# Patient Record
Sex: Female | Born: 1955
Health system: Southern US, Community
[De-identification: ages and names within clinical notes are randomized; demographics above are authoritative.]

## PROBLEM LIST (undated history)

## (undated) DIAGNOSIS — M489 Spondylopathy, unspecified: Secondary | ICD-10-CM

## (undated) DIAGNOSIS — I10 Essential (primary) hypertension: Secondary | ICD-10-CM

## (undated) DIAGNOSIS — R9439 Abnormal result of other cardiovascular function study: Secondary | ICD-10-CM

## (undated) DIAGNOSIS — E7800 Pure hypercholesterolemia, unspecified: Secondary | ICD-10-CM

## (undated) DIAGNOSIS — M25569 Pain in unspecified knee: Secondary | ICD-10-CM

## (undated) DIAGNOSIS — S42009A Fracture of unspecified part of unspecified clavicle, initial encounter for closed fracture: Secondary | ICD-10-CM

## (undated) DIAGNOSIS — M549 Dorsalgia, unspecified: Secondary | ICD-10-CM

## (undated) DIAGNOSIS — D649 Anemia, unspecified: Secondary | ICD-10-CM

## (undated) DIAGNOSIS — D259 Leiomyoma of uterus, unspecified: Secondary | ICD-10-CM

## (undated) DIAGNOSIS — E785 Hyperlipidemia, unspecified: Secondary | ICD-10-CM

## (undated) DIAGNOSIS — Z01810 Encounter for preprocedural cardiovascular examination: Secondary | ICD-10-CM

## (undated) DIAGNOSIS — S2239XA Fracture of one rib, unspecified side, initial encounter for closed fracture: Secondary | ICD-10-CM

## (undated) DIAGNOSIS — R079 Chest pain, unspecified: Secondary | ICD-10-CM

## (undated) DIAGNOSIS — M5030 Other cervical disc degeneration, unspecified cervical region: Secondary | ICD-10-CM

## (undated) DIAGNOSIS — K219 Gastro-esophageal reflux disease without esophagitis: Secondary | ICD-10-CM

## (undated) DIAGNOSIS — L259 Unspecified contact dermatitis, unspecified cause: Secondary | ICD-10-CM

## (undated) HISTORY — DX: Other cervical disc degeneration, unspecified cervical region: M50.30

## (undated) HISTORY — DX: Gastro-esophageal reflux disease without esophagitis: K21.9

## (undated) HISTORY — DX: Anemia, unspecified: D64.9

## (undated) HISTORY — DX: Fracture of unspecified part of unspecified clavicle, initial encounter for closed fracture: S42.009A

## (undated) HISTORY — DX: Spondylopathy, unspecified: M48.9

## (undated) HISTORY — PX: BILATERAL SALPINGOOPHORECTOMY: SHX1223

## (undated) HISTORY — PX: TOTAL ABDOMINAL HYSTERECTOMY: SHX209

## (undated) HISTORY — DX: Hyperlipidemia, unspecified: E78.5

## (undated) HISTORY — DX: Essential (primary) hypertension: I10

## (undated) HISTORY — DX: Pure hypercholesterolemia, unspecified: E78.00

## (undated) HISTORY — DX: Encounter for preprocedural cardiovascular examination: Z01.810

## (undated) HISTORY — DX: Leiomyoma of uterus, unspecified: D25.9

## (undated) HISTORY — DX: Dorsalgia, unspecified: M54.9

## (undated) HISTORY — DX: Chest pain, unspecified: R07.9

## (undated) HISTORY — DX: Abnormal result of other cardiovascular function study: R94.39

## (undated) HISTORY — DX: Fracture of one rib, unspecified side, initial encounter for closed fracture: S22.39XA

## (undated) HISTORY — DX: Pain in unspecified knee: M25.569

## (undated) HISTORY — DX: Unspecified contact dermatitis, unspecified cause: L25.9

---

## 2002-02-21 HISTORY — PX: CARPAL TUNNEL RELEASE: SHX101

## 2002-12-24 DIAGNOSIS — S2239XA Fracture of one rib, unspecified side, initial encounter for closed fracture: Secondary | ICD-10-CM | POA: Insufficient documentation

## 2002-12-24 DIAGNOSIS — M1732 Unilateral post-traumatic osteoarthritis, left knee: Secondary | ICD-10-CM | POA: Insufficient documentation

## 2002-12-24 DIAGNOSIS — S42009A Fracture of unspecified part of unspecified clavicle, initial encounter for closed fracture: Secondary | ICD-10-CM | POA: Insufficient documentation

## 2009-02-28 ENCOUNTER — Inpatient Hospital Stay (HOSPITAL_COMMUNITY): Admission: AD | Admit: 2009-02-28 | Discharge: 2009-02-28 | Payer: Self-pay | Admitting: Obstetrics & Gynecology

## 2009-03-09 ENCOUNTER — Ambulatory Visit: Payer: Self-pay | Admitting: Obstetrics & Gynecology

## 2009-03-09 ENCOUNTER — Other Ambulatory Visit: Admission: RE | Admit: 2009-03-09 | Discharge: 2009-03-09 | Payer: Self-pay | Admitting: Obstetrics & Gynecology

## 2009-03-09 LAB — CONVERTED CEMR LAB
Calcium: 9.5 mg/dL (ref 8.4–10.5)
Chloride: 104 meq/L (ref 96–112)
Glucose, Bld: 89 mg/dL (ref 70–99)
HCT: 41.4 % (ref 36.0–46.0)
Hemoglobin: 13.2 g/dL (ref 12.0–15.0)
RBC: 5.16 M/uL — ABNORMAL HIGH (ref 3.87–5.11)
RDW: 13.9 % (ref 11.5–15.5)
Sodium: 140 meq/L (ref 135–145)
WBC: 9.8 10*3/uL (ref 4.0–10.5)

## 2009-03-15 ENCOUNTER — Ambulatory Visit (HOSPITAL_COMMUNITY): Admission: RE | Admit: 2009-03-15 | Discharge: 2009-03-15 | Payer: Self-pay | Admitting: Family Medicine

## 2009-03-24 ENCOUNTER — Encounter: Payer: Self-pay | Admitting: Obstetrics & Gynecology

## 2009-03-24 ENCOUNTER — Ambulatory Visit: Payer: Self-pay | Admitting: Obstetrics & Gynecology

## 2009-04-01 ENCOUNTER — Ambulatory Visit: Payer: Self-pay | Admitting: Family Medicine

## 2009-04-01 ENCOUNTER — Encounter: Payer: Self-pay | Admitting: Obstetrics and Gynecology

## 2009-04-01 LAB — CONVERTED CEMR LAB
Hemoglobin: 12.8 g/dL (ref 12.0–15.0)
MCHC: 32.9 g/dL (ref 30.0–36.0)

## 2009-05-25 ENCOUNTER — Ambulatory Visit: Payer: Self-pay | Admitting: Nurse Practitioner

## 2009-05-25 DIAGNOSIS — E7800 Pure hypercholesterolemia, unspecified: Secondary | ICD-10-CM | POA: Insufficient documentation

## 2009-05-25 DIAGNOSIS — I10 Essential (primary) hypertension: Secondary | ICD-10-CM | POA: Insufficient documentation

## 2009-05-25 DIAGNOSIS — M5030 Other cervical disc degeneration, unspecified cervical region: Secondary | ICD-10-CM | POA: Insufficient documentation

## 2009-05-25 DIAGNOSIS — M549 Dorsalgia, unspecified: Secondary | ICD-10-CM | POA: Insufficient documentation

## 2009-05-25 LAB — CONVERTED CEMR LAB
Alkaline Phosphatase: 73 units/L (ref 39–117)
BUN: 10 mg/dL (ref 6–23)
Basophils Relative: 0 % (ref 0–1)
CO2: 25 meq/L (ref 19–32)
Cholesterol, target level: 200 mg/dL
Eosinophils Absolute: 0.5 10*3/uL (ref 0.0–0.7)
Eosinophils Relative: 5 % (ref 0–5)
Glucose, Bld: 101 mg/dL — ABNORMAL HIGH (ref 70–99)
Hemoglobin: 13.8 g/dL (ref 12.0–15.0)
LDL Cholesterol: 162 mg/dL — ABNORMAL HIGH (ref 0–99)
Lymphs Abs: 1.9 10*3/uL (ref 0.7–4.0)
MCHC: 32.2 g/dL (ref 30.0–36.0)
MCV: 79.2 fL (ref 78.0–100.0)
Microalb, Ur: 0.5 mg/dL (ref 0.00–1.89)
Monocytes Relative: 5 % (ref 3–12)
Neutro Abs: 8.2 10*3/uL — ABNORMAL HIGH (ref 1.7–7.7)
RBC: 5.42 M/uL — ABNORMAL HIGH (ref 3.87–5.11)
RDW: 14.4 % (ref 11.5–15.5)
Total Bilirubin: 0.4 mg/dL (ref 0.3–1.2)
Total CHOL/HDL Ratio: 5.3
VLDL: 28 mg/dL (ref 0–40)

## 2009-05-26 ENCOUNTER — Telehealth (INDEPENDENT_AMBULATORY_CARE_PROVIDER_SITE_OTHER): Payer: Self-pay | Admitting: Nurse Practitioner

## 2009-05-27 ENCOUNTER — Telehealth (INDEPENDENT_AMBULATORY_CARE_PROVIDER_SITE_OTHER): Payer: Self-pay | Admitting: Nurse Practitioner

## 2009-05-27 ENCOUNTER — Encounter (INDEPENDENT_AMBULATORY_CARE_PROVIDER_SITE_OTHER): Payer: Self-pay | Admitting: Nurse Practitioner

## 2009-05-30 ENCOUNTER — Ambulatory Visit (HOSPITAL_COMMUNITY): Admission: RE | Admit: 2009-05-30 | Discharge: 2009-05-30 | Payer: Self-pay | Admitting: Family Medicine

## 2009-05-31 ENCOUNTER — Encounter (INDEPENDENT_AMBULATORY_CARE_PROVIDER_SITE_OTHER): Payer: Self-pay | Admitting: Nurse Practitioner

## 2009-06-09 ENCOUNTER — Ambulatory Visit: Payer: Self-pay | Admitting: Nurse Practitioner

## 2009-06-09 ENCOUNTER — Telehealth (INDEPENDENT_AMBULATORY_CARE_PROVIDER_SITE_OTHER): Payer: Self-pay | Admitting: Nurse Practitioner

## 2009-06-10 ENCOUNTER — Encounter: Payer: Self-pay | Admitting: *Deleted

## 2009-06-24 ENCOUNTER — Ambulatory Visit: Payer: Self-pay | Admitting: Nurse Practitioner

## 2009-07-01 ENCOUNTER — Ambulatory Visit: Payer: Self-pay | Admitting: Obstetrics & Gynecology

## 2009-07-06 ENCOUNTER — Ambulatory Visit: Payer: Self-pay | Admitting: Nurse Practitioner

## 2009-07-06 DIAGNOSIS — L259 Unspecified contact dermatitis, unspecified cause: Secondary | ICD-10-CM | POA: Insufficient documentation

## 2009-07-11 ENCOUNTER — Telehealth (INDEPENDENT_AMBULATORY_CARE_PROVIDER_SITE_OTHER): Payer: Self-pay | Admitting: Nurse Practitioner

## 2009-07-12 ENCOUNTER — Telehealth (INDEPENDENT_AMBULATORY_CARE_PROVIDER_SITE_OTHER): Payer: Self-pay

## 2009-07-13 ENCOUNTER — Ambulatory Visit: Payer: Self-pay

## 2009-07-13 ENCOUNTER — Encounter: Payer: Self-pay | Admitting: Internal Medicine

## 2009-07-15 ENCOUNTER — Telehealth (INDEPENDENT_AMBULATORY_CARE_PROVIDER_SITE_OTHER): Payer: Self-pay | Admitting: *Deleted

## 2009-07-16 ENCOUNTER — Encounter: Payer: Self-pay | Admitting: Cardiovascular Disease

## 2009-07-18 ENCOUNTER — Telehealth (INDEPENDENT_AMBULATORY_CARE_PROVIDER_SITE_OTHER): Payer: Self-pay

## 2009-07-19 ENCOUNTER — Encounter (INDEPENDENT_AMBULATORY_CARE_PROVIDER_SITE_OTHER): Payer: Self-pay | Admitting: *Deleted

## 2009-07-19 ENCOUNTER — Encounter (INDEPENDENT_AMBULATORY_CARE_PROVIDER_SITE_OTHER): Payer: Self-pay | Admitting: Nurse Practitioner

## 2009-07-19 ENCOUNTER — Ambulatory Visit: Payer: Self-pay | Admitting: Cardiovascular Disease

## 2009-07-19 DIAGNOSIS — R9439 Abnormal result of other cardiovascular function study: Secondary | ICD-10-CM | POA: Insufficient documentation

## 2009-07-19 DIAGNOSIS — R079 Chest pain, unspecified: Secondary | ICD-10-CM | POA: Insufficient documentation

## 2009-07-20 ENCOUNTER — Ambulatory Visit: Payer: Self-pay | Admitting: Cardiovascular Disease

## 2009-07-22 ENCOUNTER — Ambulatory Visit (HOSPITAL_COMMUNITY): Admission: RE | Admit: 2009-07-22 | Discharge: 2009-07-22 | Payer: Self-pay | Admitting: Cardiovascular Disease

## 2009-07-22 ENCOUNTER — Ambulatory Visit: Payer: Self-pay | Admitting: Cardiovascular Disease

## 2009-07-22 LAB — CONVERTED CEMR LAB
CO2: 22 meq/L (ref 19–32)
Calcium: 9.8 mg/dL (ref 8.4–10.5)
Glucose, Bld: 85 mg/dL (ref 70–99)
MCHC: 31.9 g/dL (ref 30.0–36.0)
Platelets: 328 10*3/uL (ref 150–400)
Potassium: 4.7 meq/L (ref 3.5–5.3)
Prothrombin Time: 11.1 s (ref 10.9–13.3)
RDW: 16.2 % — ABNORMAL HIGH (ref 11.5–15.5)
Sodium: 142 meq/L (ref 135–145)

## 2009-08-01 ENCOUNTER — Ambulatory Visit: Payer: Self-pay | Admitting: Obstetrics & Gynecology

## 2009-08-01 ENCOUNTER — Inpatient Hospital Stay (HOSPITAL_COMMUNITY): Admission: RE | Admit: 2009-08-01 | Discharge: 2009-08-03 | Payer: Self-pay | Admitting: Obstetrics & Gynecology

## 2009-08-01 ENCOUNTER — Encounter: Payer: Self-pay | Admitting: Obstetrics & Gynecology

## 2009-08-01 DIAGNOSIS — Z90710 Acquired absence of both cervix and uterus: Secondary | ICD-10-CM | POA: Insufficient documentation

## 2009-08-02 DIAGNOSIS — D649 Anemia, unspecified: Secondary | ICD-10-CM | POA: Insufficient documentation

## 2009-08-02 DIAGNOSIS — K219 Gastro-esophageal reflux disease without esophagitis: Secondary | ICD-10-CM | POA: Insufficient documentation

## 2009-08-02 DIAGNOSIS — E785 Hyperlipidemia, unspecified: Secondary | ICD-10-CM | POA: Insufficient documentation

## 2009-08-10 ENCOUNTER — Ambulatory Visit: Payer: Self-pay | Admitting: Obstetrics & Gynecology

## 2009-08-17 ENCOUNTER — Ambulatory Visit: Payer: Self-pay | Admitting: Obstetrics and Gynecology

## 2009-09-05 ENCOUNTER — Encounter (INDEPENDENT_AMBULATORY_CARE_PROVIDER_SITE_OTHER): Payer: Self-pay | Admitting: *Deleted

## 2009-09-08 ENCOUNTER — Ambulatory Visit: Payer: Self-pay | Admitting: Obstetrics and Gynecology

## 2009-09-15 ENCOUNTER — Ambulatory Visit: Payer: Self-pay | Admitting: Obstetrics & Gynecology

## 2009-09-26 ENCOUNTER — Ambulatory Visit: Payer: Self-pay | Admitting: Nurse Practitioner

## 2009-09-26 DIAGNOSIS — M542 Cervicalgia: Secondary | ICD-10-CM | POA: Insufficient documentation

## 2009-10-05 ENCOUNTER — Ambulatory Visit: Payer: Self-pay | Admitting: Nurse Practitioner

## 2009-10-06 ENCOUNTER — Encounter (INDEPENDENT_AMBULATORY_CARE_PROVIDER_SITE_OTHER): Payer: Self-pay | Admitting: Nurse Practitioner

## 2009-10-06 LAB — CONVERTED CEMR LAB
ALT: 12 U/L
AST: 19 U/L
Albumin: 4.3 g/dL
Alkaline Phosphatase: 62 U/L
Bilirubin, Direct: <0.1 mg/dL
Cholesterol: 222 mg/dL — ABNORMAL HIGH
HDL: 41 mg/dL
LDL Cholesterol: 127 mg/dL — ABNORMAL HIGH
Total Bilirubin: 0.2 mg/dL — ABNORMAL LOW
Total CHOL/HDL Ratio: 5.4
Total Protein: 7.4 g/dL
Triglycerides: 270 mg/dL — ABNORMAL HIGH
VLDL: 54 mg/dL — ABNORMAL HIGH

## 2009-10-24 ENCOUNTER — Telehealth (INDEPENDENT_AMBULATORY_CARE_PROVIDER_SITE_OTHER): Payer: Self-pay | Admitting: Nurse Practitioner

## 2009-10-27 ENCOUNTER — Ambulatory Visit: Payer: Self-pay | Admitting: Nurse Practitioner

## 2009-10-27 DIAGNOSIS — F411 Generalized anxiety disorder: Secondary | ICD-10-CM | POA: Insufficient documentation

## 2009-11-24 ENCOUNTER — Ambulatory Visit: Payer: Self-pay | Admitting: Nurse Practitioner

## 2009-11-24 DIAGNOSIS — N951 Menopausal and female climacteric states: Secondary | ICD-10-CM | POA: Insufficient documentation

## 2010-01-13 ENCOUNTER — Encounter (INDEPENDENT_AMBULATORY_CARE_PROVIDER_SITE_OTHER): Payer: Self-pay | Admitting: Nurse Practitioner

## 2010-01-25 ENCOUNTER — Ambulatory Visit: Payer: Self-pay | Admitting: Nurse Practitioner

## 2010-01-26 LAB — CONVERTED CEMR LAB
AST: 15 units/L (ref 0–37)
Bilirubin, Direct: <0.1 mg/dL (ref 0.0–0.3)
Cholesterol: 240 mg/dL — ABNORMAL HIGH (ref 0–200)
HDL: 47 mg/dL (ref >39)
LDL Cholesterol: 157 mg/dL — ABNORMAL HIGH (ref 0–99)
Total Bilirubin: 0.2 mg/dL — ABNORMAL LOW (ref 0.3–1.2)
Total CHOL/HDL Ratio: 5.1
Total Protein: 7.3 g/dL (ref 6.0–8.3)
Triglycerides: 180 mg/dL — ABNORMAL HIGH (ref <150)
VLDL: 36 mg/dL (ref 0–40)

## 2010-01-27 ENCOUNTER — Ambulatory Visit: Payer: Self-pay | Admitting: Nurse Practitioner

## 2010-03-30 ENCOUNTER — Encounter (INDEPENDENT_AMBULATORY_CARE_PROVIDER_SITE_OTHER): Payer: Self-pay | Admitting: Nurse Practitioner

## 2010-04-11 ENCOUNTER — Encounter: Admission: RE | Admit: 2010-04-11 | Discharge: 2010-04-11 | Payer: Self-pay | Admitting: Neurosurgery

## 2010-04-20 ENCOUNTER — Ambulatory Visit: Payer: Self-pay | Admitting: Obstetrics & Gynecology

## 2010-04-24 ENCOUNTER — Ambulatory Visit: Payer: Self-pay | Admitting: Nurse Practitioner

## 2010-04-24 ENCOUNTER — Telehealth (INDEPENDENT_AMBULATORY_CARE_PROVIDER_SITE_OTHER): Payer: Self-pay | Admitting: Nurse Practitioner

## 2010-04-25 ENCOUNTER — Encounter (INDEPENDENT_AMBULATORY_CARE_PROVIDER_SITE_OTHER): Payer: Self-pay | Admitting: Nurse Practitioner

## 2010-04-25 LAB — CONVERTED CEMR LAB
ALT: 18 units/L (ref 0–35)
Alkaline Phosphatase: 78 units/L (ref 39–117)
Bilirubin, Direct: 0.1 mg/dL (ref 0.0–0.3)
Cholesterol: 217 mg/dL — ABNORMAL HIGH (ref 0–200)
VLDL: 24 mg/dL (ref 0–40)

## 2010-04-26 ENCOUNTER — Ambulatory Visit (HOSPITAL_COMMUNITY): Admission: RE | Admit: 2010-04-26 | Discharge: 2010-04-26 | Payer: Self-pay | Admitting: Obstetrics & Gynecology

## 2010-05-04 ENCOUNTER — Encounter (INDEPENDENT_AMBULATORY_CARE_PROVIDER_SITE_OTHER): Payer: Self-pay | Admitting: *Deleted

## 2010-07-20 ENCOUNTER — Ambulatory Visit: Payer: Self-pay | Admitting: Nurse Practitioner

## 2010-07-20 ENCOUNTER — Encounter (INDEPENDENT_AMBULATORY_CARE_PROVIDER_SITE_OTHER): Payer: Self-pay | Admitting: *Deleted

## 2010-07-20 DIAGNOSIS — M2550 Pain in unspecified joint: Secondary | ICD-10-CM | POA: Insufficient documentation

## 2010-07-21 LAB — CONVERTED CEMR LAB
CRP: 1.2 mg/dL — ABNORMAL HIGH (ref <0.6)
Sed Rate: 36 mm/hr — ABNORMAL HIGH (ref 0–22)

## 2010-07-24 ENCOUNTER — Telehealth (INDEPENDENT_AMBULATORY_CARE_PROVIDER_SITE_OTHER): Payer: Self-pay | Admitting: Nurse Practitioner

## 2010-08-02 ENCOUNTER — Telehealth (INDEPENDENT_AMBULATORY_CARE_PROVIDER_SITE_OTHER): Payer: Self-pay | Admitting: Nurse Practitioner

## 2010-08-10 ENCOUNTER — Encounter (INDEPENDENT_AMBULATORY_CARE_PROVIDER_SITE_OTHER): Payer: Self-pay | Admitting: *Deleted

## 2010-08-16 ENCOUNTER — Ambulatory Visit: Payer: Self-pay | Admitting: Gastroenterology

## 2010-09-06 ENCOUNTER — Ambulatory Visit: Payer: Self-pay | Admitting: Internal Medicine

## 2010-09-06 HISTORY — PX: COLONOSCOPY: SHX174

## 2010-09-08 ENCOUNTER — Encounter: Payer: Self-pay | Admitting: Internal Medicine

## 2010-11-06 ENCOUNTER — Telehealth (INDEPENDENT_AMBULATORY_CARE_PROVIDER_SITE_OTHER): Payer: Self-pay | Admitting: Nurse Practitioner

## 2010-12-01 ENCOUNTER — Telehealth (INDEPENDENT_AMBULATORY_CARE_PROVIDER_SITE_OTHER): Payer: Self-pay | Admitting: Nurse Practitioner

## 2010-12-22 ENCOUNTER — Ambulatory Visit: Payer: Self-pay | Admitting: Nurse Practitioner

## 2010-12-22 ENCOUNTER — Telehealth (INDEPENDENT_AMBULATORY_CARE_PROVIDER_SITE_OTHER): Payer: Self-pay | Admitting: Nurse Practitioner

## 2011-01-14 ENCOUNTER — Encounter: Payer: Self-pay | Admitting: Neurosurgery

## 2011-01-23 NOTE — Letter (Signed)
Summary: [facility]ERR[facility]  [facility]   Imported By: Arta Bruce 01/13/2010 10:42:42  _____________________________________________________________________  External Attachment:    Type:   Image     Comment:   External Document

## 2011-01-23 NOTE — Letter (Signed)
Summary: Previsit letter  [facility]  [address] [place], [place] [address]   Phone: [phone]  Fax: [phone]       07/20/2010 MRN: [id number]  Susan Kaufman [address] [facility] [address] [address] [place], [place]  [address]  Dear Ms. [facility],  Welcome to the Gastroenterology Division at [facility].    You are scheduled to see a nurse for your pre-procedure visit on August 15, 2010 at 2:00pm on the 3rd floor at [facility], [address]  We ask that you try to arrive at our office 15 minutes prior to your appointment time to allow for check-in.  Your nurse visit will consist of discussing your medical and surgical history, your immediate family medical history, and your medications.    Please bring a complete list of all your medications or, if you prefer, bring the medication bottles and we will list them.  We will need to be aware of both prescribed and over the counter drugs.  We will need to know exact dosage information as well.  If you are on blood thinners (Coumadin, Plavix, Aggrenox, Ticlid, etc.) please call our office today/prior to your appointment, as we need to consult with your physician about holding your medication.   Please be prepared to read and sign documents such as consent forms, a financial agreement, and acknowledgement forms.  If necessary, and with your consent, a friend or relative is welcome to sit-in on the nurse visit with you.  Please bring your insurance card so that we may make a copy of it.  If your insurance requires a referral to see a specialist, please bring your referral form from your primary care physician.  No co-pay is required for this nurse visit.     If you cannot keep your appointment, please call [phone] to cancel or reschedule prior to your appointment date.  This allows [place] the opportunity to schedule an appointment for another patient in need of care.    Thank you for choosing [facility]  for your medical needs.  We appreciate the opportunity to care for you.  Please visit [place] at our website  to learn more about our practice.                     Sincerely.                                                                                                                   The Gastroenterology Division

## 2011-01-23 NOTE — Miscellaneous (Signed)
Summary: [facility] previsit  Clinical Lists Changes  Medications: Added new medication of MOVIPREP 100 GM  SOLR (PEG-KCL-NACL-NASULF-NA ASC-C) As per prep instructions. - Signed Rx of MOVIPREP 100 GM  SOLR (PEG-KCL-NACL-NASULF-NA ASC-C) As per prep instructions.;  #1 x 0;  Signed;  Entered by: Karl Bales RN;  Authorized by: Hart Carwin MD;  Method used: Electronically to [facility] [phone] [place], [place], [place]  [address], Ph: [phone], Fax: [phone]    Prescriptions: MOVIPREP 100 GM  SOLR (PEG-KCL-NACL-NASULF-NA ASC-C) As per prep instructions.  #1 x 0   Entered by:   Karl Bales RN   Authorized by:   Hart Carwin MD   Signed by:   Karl Bales RN on 08/16/2010   Method used:   Electronically to        [facility] [address] (retail)       [phone]       [place], [place]  [address]       Ph: [phone]       Fax: [phone]   RxID:   [id number]

## 2011-01-23 NOTE — Assessment & Plan Note (Signed)
Summary: Back pain   Vital Signs:  Patient profile:   55 year old female Menstrual status:  hysterectomy Weight:      213 pounds Temp:     98.3 degrees F Pulse rate:   59 / minute Pulse rhythm:   regular Resp:     20 per minute BP sitting:   107 / 71  (left arm) Cuff size:   large  Vitals Entered By: Vesta Mixer CMA (Apr 24, 2010 2:17 PM) CC: f/u back pain, she did have her neck injection and it helped, is scheduled for another one, but wants to know if it can be in her back instead of her neck and was told by her Gyn she needs a colonoscopy.  Pt is fasting for cholesterol check.  Per pt not taking HCTZ regularly only when she feels adema., Hypertension Management, Lipid Management Is Patient Diabetic? No Pain Assessment Patient in pain? yes     Location: back Intensity: 4  Does patient need assistance? Ambulation Normal     Last PAP Result done per GYN   Primary Care Provider:  [name] FNP  CC:  f/u back pain, she did have her neck injection and it helped, is scheduled for another one, but wants to know if it can be in her back instead of her neck and was told by her Gyn she needs a colonoscopy.  Pt is fasting for cholesterol check.  Per pt not taking HCTZ regularly only when she feels adema., Hypertension Management, and Lipid Management.  History of Present Illness:  Pt into the office for f/u on back pain. Pt has been to neurosurgery and she was recommended to have neck and back surgery.  Pt declined at this time Spinal injection done in neck (done last week) Second cervical injection is scheduled on May 20th Pt is still having low back pain and is wondering if she can get an injection in her lower back to help with pain. Neck pain and radiculopathy has improved with cervical injection  GYN - yearly evaluation done Recommendation per GYN no history of colonscopy Pt does have some constipation issues which has worsened since her hysterectomy  Son present  today with pt  Hypertension History:      She denies headache, chest pain, and palpitations.  She notes no problems with any antihypertensive medication side effects.        Positive major cardiovascular risk factors include hyperlipidemia and hypertension.  Negative major cardiovascular risk factors include female age less than 68 years old and no history of diabetes.        Further assessment for target organ damage reveals no history of ASHD, cardiac end-organ damage (CHF/LVH), stroke/TIA, peripheral vascular disease, renal insufficiency, or hypertensive retinopathy.    Lipid Management History:      Positive NCEP/ATP III risk factors include hypertension.  Negative NCEP/ATP III risk factors include female age less than 40 years old, non-diabetic, no ASHD (atherosclerotic heart disease), no prior stroke/TIA, no peripheral vascular disease, and no history of aortic aneurysm.        The patient states that she knows about the "Therapeutic Lifestyle Change" diet.  Her compliance with the TLC diet is fair.  The patient does not know about adjunctive measures for cholesterol lowering.  Comments include: pt is taking meds as ordered.  The patient denies any symptoms to suggest myopathy or liver disease.      Current Medications (verified): 1)  Vytorin 10-20 Mg Tabs (Ezetimibe-Simvastatin) .Marland KitchenMarland KitchenMarland Kitchen  One Tablet By Mouth Nightly For Cholesterol 2)  Lisinopril 20 Mg Tabs (Lisinopril) .... One Tablet By Mouth Daily For Blood Pressure 3)  Bayer Low Strength 81 Mg Tbec (Aspirin) .... One Tab Daily 4)  Nexium 40 Mg Cpdr (Esomeprazole Magnesium) .... One Tablet By Mouth Daily For Stomach 5)  Qc Womens Daily Multivitamin  Tabs (Multiple Vitamins-Minerals) .... One Daily 6)  Cvs Iron 325 (65 Fe) Mg Tabs (Ferrous Sulfate) .... One Tab Daily 7)  Hydrochlorothiazide 25 Mg Tabs (Hydrochlorothiazide) .... Take One (1) By Mouth Daily As Needed For Swelling 8)  Hydrocodone-Acetaminophen 5-325 Mg Tabs  (Hydrocodone-Acetaminophen) .... One Tablet By Mouth Two Times A Day As Needed For Pain 9)  Atenolol 100 Mg Tabs (Atenolol) .... One Tablet By Mouth Nightly 10)  Lexapro 10 Mg Tabs (Escitalopram Oxalate) .... One Tablet By Mouth Daily For Mood 11)  Alprazolam 0.5 Mg Tabs (Alprazolam) .... One Tablet By Mouth Daily As Needed For Nerves  Allergies (verified): 1)  ! Asa  Review of Systems CV:  Denies chest pain or discomfort and swelling of feet. Resp:  Denies cough. GI:  Complains of constipation. MS:  Complains of low back pain.  Physical Exam  General:  alert.  obese Head:  normocephalic.  covered Eyes:  pupils round.   Lungs:  normal breath sounds.   Heart:  normal rate and regular rhythm.   Msk:  up to the exam table Extremities:  no edema Neurologic:  alert & oriented X3.   Skin:  color normal.   Psych:  Oriented X3.     Impression & Recommendations:  Problem # 1:  HYPERCHOLESTEROLEMIA (ICD-272.0) Pt is fasting today vytorin started in february.  will recheck labs today Her updated medication list for this problem includes:    Vytorin 10-20 Mg Tabs (Ezetimibe-simvastatin) ..... One tablet by mouth nightly for cholesterol  Orders: T-Lipid Profile (36644-03474) T-Hepatic Function (25956-38756)  Problem # 2:  SCREENING, COLON CANCER (ICD-V76.51) Will refer  Orders: Colonoscopy (Colon)  Problem # 3:  BACK PAIN (ICD-724.5) will improved with weight loss but pt would like to explore the option of spinal injections Her updated medication list for this problem includes:    Bayer Low Strength 81 Mg Tbec (Aspirin) ..... One tab daily    Hydrocodone-acetaminophen 5-325 Mg Tabs (Hydrocodone-acetaminophen) ..... One tablet by mouth two times a day as needed for pain  Problem # 4:  NECK PAIN (ICD-723.1) continue  injections as per neurosurgery as started Her updated medication list for this problem includes:    Bayer Low Strength 81 Mg Tbec (Aspirin) ..... One tab daily     Hydrocodone-acetaminophen 5-325 Mg Tabs (Hydrocodone-acetaminophen) ..... One tablet by mouth two times a day as needed for pain  Complete Medication List: 1)  Vytorin 10-20 Mg Tabs (Ezetimibe-simvastatin) .... One tablet by mouth nightly for cholesterol 2)  Lisinopril 20 Mg Tabs (Lisinopril) .... One tablet by mouth daily for blood pressure 3)  Bayer Low Strength 81 Mg Tbec (Aspirin) .... One tab daily 4)  Nexium 40 Mg Cpdr (Esomeprazole magnesium) .... One tablet by mouth daily for stomach 5)  Qc Womens Daily Multivitamin Tabs (Multiple vitamins-minerals) .... One daily 6)  Cvs Iron 325 (65 Fe) Mg Tabs (Ferrous sulfate) .... One tab daily 7)  Hydrochlorothiazide 25 Mg Tabs (Hydrochlorothiazide) .... Take one (1) by mouth daily as needed for swelling 8)  Hydrocodone-acetaminophen 5-325 Mg Tabs (Hydrocodone-acetaminophen) .... One tablet by mouth two times a day as needed for pain 9)  Atenolol 100 Mg Tabs (Atenolol) .... One tablet by mouth nightly 10)  Lexapro 10 Mg Tabs (Escitalopram oxalate) .... One tablet by mouth daily for mood 11)  Alprazolam 0.5 Mg Tabs (Alprazolam) .... One tablet by mouth daily as needed for nerves  Hypertension Assessment/Plan:      The patient's hypertensive risk group is category B: At least one risk factor (excluding diabetes) with no target organ damage.  Her calculated 10 year risk of coronary heart disease is 5 %.  Today's blood pressure is 107/71.  Her blood pressure goal is < 140/90.  Lipid Assessment/Plan:      Based on NCEP/ATP III, the patient's risk factor category is "0-1 risk factors".  The patient's lipid goals are as follows: Total cholesterol goal is 200; LDL cholesterol goal is 160; HDL cholesterol goal is 40; Triglyceride goal is 150.    Patient Instructions: 1)  You will be referred to Gastroentrology for colonscopy - Your son will be notified of the time/date of the appointment 2)  Blood pressure - much improved 3)  Weight loss - congrats on  weight loss.  Keep up the good work. 4)  Neck and back pain - keep scheduled appointment for neck injection. Son to ask if you can get back injections 5)  Follow up in this office in 3 months or sooner if necessary.

## 2011-01-23 NOTE — Progress Notes (Signed)
Summary: PAIN MEDS NEEDS REFILL  Phone Note Call from Patient Call back at Home Phone [phone]   [facility] Call: Refill Medication Summary of Call: MARTIN PT. MS Madd[place] SON, HSABAZ CALLED AND SAYS THAT SHE NEEDS ANOTHER REFILL ON HER PAIN MEDICATION TO BE CALLED INTO [facility] ON [address]  SHE TRIED TO GET AN APPT. BUT THE NEXT AVAILABLE IS 12/28 AND THATS WHEN SHE IS COMING Initial call taken by: Leodis Rains,  November 06, 2010 4:54 PM  Follow-up for Phone Call        refill done on 10/30/2010 - has her son checked the pharmacy? Follow-up by: [name] FNP,  November 06, 2010 6:44 PM  Additional Follow-up for Phone Call Additional follow up Details #1::        Left message on voicemail for pt. to return call.  Dutch Quint RN  November 07, 2010 5:22 PM  Left message on voicemail for pt. to return call.  Dutch Quint RN  November 08, 2010 10:22 AM  PT RETURN YOUR CALL   Additional Follow-up by: Domenic Polite,  November 08, 2010 2:29 PM    Additional Follow-up for Phone Call Additional follow up Details #2::    Pharmacy never received Rx -- called in as ordered to [facility] on [address].  Son notified.  Dutch Quint RN  November 08, 2010 5:15 PM

## 2011-01-23 NOTE — Progress Notes (Signed)
Summary: Nexium, Lexapro  Phone Note From Other Clinic   Summary of Call: received call from [facility] [address] and pt brought in Rx for Nexium and Lexapro and needs an Rx for a cheaper medication because pt can not afford the Nexium and Lexapro. [phone] Initial call taken by: Levon Hedger,  July 24, 2010 12:39 PM  Follow-up for Phone Call        My error - pt needs to get these meds from [facility] pharmacy due to the cost. I will send a new Rx to [facility] electronically. if I recall she has gotten them from [facility] pharmacy before. Follow-up by: [name] FNP,  July 25, 2010 8:00 AM  Additional Follow-up for Phone Call Additional follow up Details #1::        pt's son Moreen Fowler is informed of above information. Additional Follow-up by: Levon Hedger,  July 25, 2010 3:05 PM    Prescriptions: LEXAPRO 10 MG TABS (ESCITALOPRAM OXALATE) One tablet by mouth daily for mood  #30 x 5   Entered and Authorized by:   [name] FNP   Signed by:   [name] FNP on 07/25/2010   Method used:   Faxed to ...       [facility] (retail)       [address] [place], [place]  [address]       Ph: [phone]       Fax: [phone]   RxID:   [id number] NEXIUM 40 MG CPDR (ESOMEPRAZOLE MAGNESIUM) One tablet by mouth daily for stomach  #30 x 5   Entered and Authorized by:   [name] FNP   Signed by:   [name] FNP on 07/25/2010   Method used:   Faxed to ...       [facility] (retail)       [address] [place], [place]  [address]       Ph: [phone]       Fax: [phone]   RxID:   [id number]

## 2011-01-23 NOTE — Procedures (Signed)
Summary: Colonoscopy  Patient: Susan Kaufman Note: All result statuses are Final unless otherwise noted.  Tests: (1) Colonoscopy (COL)   COL Colonoscopy           DONE     [facility]     [address]     [place], [place]  [address]           COLONOSCOPY PROCEDURE REPORT           PATIENT:  [name], [name]  MR#:  [id number]     BIRTHDATE:  Jan 21, 1956, 54 yrs. old  GENDER:  female     ENDOSCOPIST:  Hedwig [facility]. Juanda Chance, MD     REF. BY:  [name], FNP     PROCEDURE DATE:  09/06/2010     PROCEDURE:  Colonoscopy [address]     ASA CLASS:  Class I     INDICATIONS:  Routine Risk Screening     MEDICATIONS:   Versed 10 mg, Fentanyl 100 mcg           DESCRIPTION OF PROCEDURE:   After the risks benefits and     alternatives of the procedure were thoroughly explained, informed     consent was obtained.  Digital rectal exam was performed and     revealed no rectal masses.   The LB [id number] endoscope     was introduced through the anus and advanced to the cecum, which     was identified by both the appendix and ileocecal valve, without     limitations.  The quality of the prep was good, using MiraLax.     The instrument was then slowly withdrawn as the colon was fully     examined.     <<PROCEDUREIMAGES>>           FINDINGS:  A sessile polyp was found at the ileocecal valve. 5 mm     flat polyp vs nodule on the valve The polyp was removed using cold     biopsy forceps (see image1 and image2).  This was otherwise a     normal examination of the colon (see image3, image4, image5,     image6, and image7).  Internal hemorrhoids were found (see image8     and image7).   Retroflexed views in the rectum revealed no     abnormalities.    The scope was then withdrawn from the patient     and the procedure completed.           COMPLICATIONS:  None     ENDOSCOPIC IMPRESSION:     1) Sessile polyp at the ileocecal valve     2) Otherwise normal examination     3) Internal  hemorrhoids     RECOMMENDATIONS:     1) Await pathology results     2) High fiber diet.     Try MOM 30 cc po qhs prn constipation     as an alternative, may use Mineral oil 1 tablsp po qd prn     constipation     REPEAT EXAM:  In 10 year(s) for.           ______________________________     Hedwig [facility]. Juanda Chance, MD           CC:           n.     eSIGNED:   Hedwig [facility]. Brodie at 09/06/2010 03:57 PM           Bayard Hugger, [phone]  Note: An exclamation mark (!) indicates a result that was not dispersed into the flowsheet. Document Creation Date: 09/06/2010 3:58 PM _______________________________________________________________________  (1) Order result status: Final Collection or observation date-time: 09/06/2010 15:46 Requested date-time:  Receipt date-time:  Reported date-time:  Referring Physician:   Ordering Physician: Lina Sar [phone]) Specimen Source:  Source: Launa Grill Order Number: [id number] Lab site:   Appended Document: Colonoscopy     Procedures Next Due Date:    Colonoscopy: 08/2020

## 2011-01-23 NOTE — Progress Notes (Signed)
Summary: Colonscopy referral  Phone Note Outgoing Call   Summary of Call: Refer pt for colonscopy (screening) Contact son (# in referral) with time/date of appt Initial call taken by: [name] FNP,  Apr 24, 2010 2:43 PM

## 2011-01-23 NOTE — Progress Notes (Signed)
Summary: MED CAUSING HEART BURN  Phone Note Call from Patient Call back at Home Phone [phone]   Caller: SON-SHAHBAZ Reason for Call: Talk to Doctor Summary of Call: MARTIN PT. WAS RECENTLY PRESCRIBED MELOXICAM, IS CAUSING HER TO HAVE HEART BURN, AND SHE WANTS TO KNOW WHAT TO DO OR CAN A DIFFERENT MEDICATION BE CALLED IN.  HE SAYS HE WANTS TO SPEAK WITH SOMEONE, SO THEY CAN FIGURE OUT WHATS GOING ON. Initial call taken by: Leodis Rains,  August 02, 2010 5:04 PM  Follow-up for Phone Call        forward to N. Daphine Deutscher, fnp Follow-up by: Levon Hedger,  August 03, 2010 11:47 AM  Additional Follow-up for Phone Call Additional follow up Details #1::        Is pt taking the medication AFTER FOOD? The meloxicam was for her joints.  If joint pain has resolved then she can stop taking the meloxicam Additional Follow-up by: [name] FNP,  August 03, 2010 11:59 AM    Additional Follow-up for Phone Call Additional follow up Details #2::    Left message on answering machine for pt. to return call.  Dutch Quint RN  August 04, 2010 4:29 PM  Levon Hedger  August 07, 2010 5:01 PM left message on machine for pt to return call to the office  pt's son informed of above information. Levon Hedger  August 08, 2010 3:57 PM

## 2011-01-23 NOTE — Assessment & Plan Note (Signed)
Summary: Hypercholesterolemia    Vital Signs:  Patient profile:   55 year old female Menstrual status:  hysterectomy Height:      63 inches Weight:      223 pounds Temp:     98.0 degrees F oral Pulse rate:   60 / minute Pulse rhythm:   regular Resp:     18 per minute BP sitting:   127 / 85  (left arm) Cuff size:   large  Vitals Entered By: [place] Shannon (January 27, 2010 8:28 AM) CC: f/u on labs... pt says she has a new pain in her knee thats just started three/four days ago...., Lipid Management, Depression, Abdominal Pain Pain Assessment Patient in pain? yes     Location: knee,back, neck Intensity: 6/7 Type: sharp Onset of pain  Intermittent  Does patient need assistance? Functional Status Self care Ambulation Normal   Primary Care Provider:  [name] FNP  CC:  f/u on labs... pt says she has a new pain in her knee thats just started three/four days ago...., Lipid Management, Depression, and Abdominal Pain.  History of Present Illness:  Pt into the office for follow up on labs.  Back pain - pt does have an appointment with the specialist in April 2011.   The patient denies that she feels like life is not worth living, denies that she wishes that she were dead, and denies that she has thought about ending her life.         Depression Treatment History:  Prior Medication Used:   Start Date: Assessment of Effect:   Comments:  lexapro      01/16/2010     --       --  Dyspepsia History:      There is a prior history of GERD.  The patient does not have a prior history of documented ulcer disease.  The dominant symptom is heartburn or acid reflux.  An H-2 blocker medication is not currently being taken.  She has no history of a positive H. Pylori serology.  No previous upper endoscopy has been done.    Lipid Management History:      Positive NCEP/ATP III risk factors include hypertension.  Negative NCEP/ATP III risk factors include female age less than 32  years old, non-diabetic, no ASHD (atherosclerotic heart disease), no prior stroke/TIA, no peripheral vascular disease, and no history of aortic aneurysm.        The patient states that she knows about the "Therapeutic Lifestyle Change" diet.  Her compliance with the TLC diet is fair.  The patient does not know about adjunctive measures for cholesterol lowering.  Comments include: Pt stopped taking the pravastatin reportedly due to joint pain.  When she stopped taking the pravastatin then the body aches improved.  The patient denies any symptoms to suggest myopathy or liver disease.  Comments include: stopped taking meds on 12/09/2009.       Current Medications (verified): 1)  Vytorin 10-20 Mg Tabs (Ezetimibe-Simvastatin) .... One Tablet By Mouth Nightly For Cholesterol 2)  Lisinopril 20 Mg Tabs (Lisinopril) .... One Tablet By Mouth Daily For Blood Pressure 3)  Bayer Low Strength 81 Mg Tbec (Aspirin) .... One Tab Daily 4)  Nexium 40 Mg Cpdr (Esomeprazole Magnesium) .... One Tablet By Mouth Daily For Stomach 5)  Qc Womens Daily Multivitamin  Tabs (Multiple Vitamins-Minerals) .... One Daily 6)  Cvs Iron 325 (65 Fe) Mg Tabs (Ferrous Sulfate) .... One Tab Daily 7)  Hydrochlorothiazide 25 Mg Tabs (  Hydrochlorothiazide) .... Take One (1) By Mouth Daily As Needed For Swelling 8)  Hydrocodone-Acetaminophen 5-325 Mg Tabs (Hydrocodone-Acetaminophen) .... One Tablet By Mouth Two Times A Day As Needed For Pain 9)  Atenolol 100 Mg Tabs (Atenolol) .... One Tablet By Mouth Nightly 10)  Lexapro 10 Mg Tabs (Escitalopram Oxalate) .... One Tablet By Mouth Daily For Mood 11)  Alprazolam 0.5 Mg Tabs (Alprazolam) .... One Tablet By Mouth Daily As Needed For Nerves  Allergies (verified): 1)  ! Asa  Review of Systems General:  Denies fever. CV:  Denies chest pain or discomfort. Resp:  Denies cough. GI:  Denies abdominal pain, nausea, and vomiting. MS:  Complains of low back pain; knee pain.  Physical  Exam  General:  alert.   Head:  normocephalic.   Mouth:  pharynx pink and moist.   Lungs:  normal breath sounds.   Heart:  normal rate and regular rhythm.   Abdomen:  normal bowel sounds.   Msk:  up to the exam table Neurologic:  alert & oriented X3.     Knee Exam  General:    obese.    Knee Exam:    Left:    Inspection:  Normal    Palpation:  Normal    Stability:  stable    Tenderness:  lateral capsule    Swelling:  no    Erythema:  no   Impression & Recommendations:  Problem # 1:  HYPERCHOLESTEROLEMIA (ICD-272.0) advised pt that she needs meds for her cholesterol she has agreed to start vytorin Her updated medication list for this problem includes:    Vytorin 10-20 Mg Tabs (Ezetimibe-simvastatin) ..... One tablet by mouth nightly for cholesterol  Problem # 2:  ANXIETY (ICD-300.00) pt is tolerating lexapro well.   Her updated medication list for this problem includes:    Lexapro 10 Mg Tabs (Escitalopram oxalate) ..... One tablet by mouth daily for mood    Alprazolam 0.5 Mg Tabs (Alprazolam) ..... One tablet by mouth daily as needed for nerves  Problem # 3:  BACK PAIN (ICD-724.5) Pt to keep appt with the specialist Her updated medication list for this problem includes:    Bayer Low Strength 81 Mg Tbec (Aspirin) ..... One tab daily    Hydrocodone-acetaminophen 5-325 Mg Tabs (Hydrocodone-acetaminophen) ..... One tablet by mouth two times a day as needed for pain  Problem # 4:  KNEE PAIN, LEFT (ICD-719.46) advised pt to apply  Her updated medication list for this problem includes:    Bayer Low Strength 81 Mg Tbec (Aspirin) ..... One tab daily    Hydrocodone-acetaminophen 5-325 Mg Tabs (Hydrocodone-acetaminophen) ..... One tablet by mouth two times a day as needed for pain  Complete Medication List: 1)  Vytorin 10-20 Mg Tabs (Ezetimibe-simvastatin) .... One tablet by mouth nightly for cholesterol 2)  Lisinopril 20 Mg Tabs (Lisinopril) .... One tablet by mouth daily  for blood pressure 3)  Bayer Low Strength 81 Mg Tbec (Aspirin) .... One tab daily 4)  Nexium 40 Mg Cpdr (Esomeprazole magnesium) .... One tablet by mouth daily for stomach 5)  Qc Womens Daily Multivitamin Tabs (Multiple vitamins-minerals) .... One daily 6)  Cvs Iron 325 (65 Fe) Mg Tabs (Ferrous sulfate) .... One tab daily 7)  Hydrochlorothiazide 25 Mg Tabs (Hydrochlorothiazide) .... Take one (1) by mouth daily as needed for swelling 8)  Hydrocodone-acetaminophen 5-325 Mg Tabs (Hydrocodone-acetaminophen) .... One tablet by mouth two times a day as needed for pain 9)  Atenolol 100 Mg Tabs (Atenolol) .... One  tablet by mouth nightly 10)  Lexapro 10 Mg Tabs (Escitalopram oxalate) .... One tablet by mouth daily for mood 11)  Alprazolam 0.5 Mg Tabs (Alprazolam) .... One tablet by mouth daily as needed for nerves  Lipid Assessment/Plan:      Based on NCEP/ATP III, the patient's risk factor category is "0-1 risk factors".  The patient's lipid goals are as follows: Total cholesterol goal is 200; LDL cholesterol goal is 160; HDL cholesterol goal is 40; Triglyceride goal is 150.    Patient Instructions: 1)  Start the new cholesterol - vytorin 10/20mg  by mouth nightly. 2)  You may need to sign additional paperwork at the pharmacy to obtain this medication 3)  Keep your appointment for the back specialist.   4)  Follow up in 8 weeks for repeat lipids/lft. Prescriptions: HYDROCODONE-ACETAMINOPHEN 5-325 MG TABS (HYDROCODONE-ACETAMINOPHEN) One tablet by mouth two times a day as needed for pain  #50 x 0   Entered and Authorized by:   [name] FNP   Signed by:   [name] FNP on 01/27/2010   Method used:   Print then Give to Patient   RxID:   [id number] ALPRAZOLAM 0.5 MG TABS (ALPRAZOLAM) One tablet by mouth daily as needed for nerves  #30 x 0   Entered and Authorized by:   [name] FNP   Signed by:   [name] FNP on 01/27/2010   Method used:   Print then Give to Patient    RxID:   [id number]

## 2011-01-23 NOTE — Letter (Signed)
Summary: *HSN Results Follow up  [facility]  [address] [place], [place] [address]   Phone: [phone]  Fax: [phone]      05/04/2010   Susan Kaufman [address] [facility] [address] [place], [place]  [address]   Dear  [name],                            ____S.Drinkard,FNP   ____D. Gore,FNP       ____B. McPherson,MD   ____V. Rankins,MD    ____E. Mulberry,MD    ____N. Daphine Deutscher, FNP  ____D. Reche Dixon, MD    ____K. Philipp Deputy, MD    ____Other     This letter is to inform you that your recent test(s):  _______Pap Smear    _______Lab Test     _______X-ray    _______ is within acceptable limits  _______ requires a medication change  _______ requires a follow-up lab visit  _______ requires a follow-up visit with your provider   Comments:  We have been trying to reach you at [phone].  Please contact the office at your earliest convenience.       _________________________________________________________ If you have any questions, please contact our office                     Sincerely,  Levon Hedger [facility]

## 2011-01-23 NOTE — Letter (Signed)
Summary: [facility] Instructions  [facility]  [address] [place], [place] [address]   Phone: [phone]  Fax: [phone]       Susan Kaufman    [birth year]    MRN: [id number]        Procedure Day Dorna Bloom:  Wednesday, 09-06-10     Arrival Time: 2:30 pm      Procedure Time: 3:30 pm     Location of Procedure:                    _x _  [facility] (4th Floor)                         PREPARATION FOR COLONOSCOPY WITH MOVIPREP   Starting 5 days prior to your procedure 09-01-10 do not eat nuts, seeds, popcorn, corn, beans, peas,  salads, or any raw vegetables.  Do not take any fiber supplements (e.g. Metamucil, Citrucel, and Benefiber).  THE DAY BEFORE YOUR PROCEDURE         DATE: Tuesday, 09-05-10  1.  Drink clear liquids the entire day-NO SOLID FOOD  2.  Do not drink anything colored red or purple.  Avoid juices with pulp.  No orange juice.  3.  Drink at least 64 oz. (8 glasses) of fluid/clear liquids during the day to prevent dehydration and help the prep work efficiently.  CLEAR LIQUIDS INCLUDE: Water Jello Ice Popsicles Tea (sugar ok, no milk/cream) Powdered fruit flavored drinks Coffee (sugar ok, no milk/cream) Gatorade Juice: apple, white grape, white cranberry  Lemonade Clear bullion, consomm, broth Carbonated beverages (any kind) Strained chicken noodle soup Hard Candy                             4.  In the morning, mix first dose of MoviPrep solution:    Empty 1 Pouch A and 1 Pouch B into the disposable container    Add lukewarm drinking water to the top line of the container. Mix to dissolve    Refrigerate (mixed solution should be used within 24 hrs)  5.  Begin drinking the prep at 5:00 p.m. The MoviPrep container is divided by 4 marks.   Every 15 minutes drink the solution down to the next mark (approximately 8 oz) until the full liter is complete.   6.  Follow completed prep with 16 oz of clear liquid of your choice  (Nothing red or purple).  Continue to drink clear liquids until bedtime.  7.  Before going to bed, mix second dose of MoviPrep solution:    Empty 1 Pouch A and 1 Pouch B into the disposable container    Add lukewarm drinking water to the top line of the container. Mix to dissolve    Refrigerate  THE DAY OF YOUR PROCEDURE      DATE: Wednesday, 09-06-10  Beginning at 9:30 a.m. (5 hours before procedure):         1. Every 15 minutes, drink the solution down to the next mark (approx 8 oz) until the full liter is complete.  2. Follow completed prep with 16 oz. of clear liquid of your choice.    3. You may drink clear liquids until 1:30 p.m. (2 HOURS BEFORE PROCEDURE).   MEDICATION INSTRUCTIONS  Unless otherwise instructed, you should take regular prescription medications with a small sip of water   as early as possible the morning of  your procedure.         OTHER INSTRUCTIONS  You will need a responsible adult at least 54 years of age to accompany you and drive you home.   This person must remain in the waiting room during your procedure.  Wear loose fitting clothing that is easily removed.  Leave jewelry and other valuables at home.  However, you may wish to bring a book to read or  an iPod/MP3 player to listen to music as you wait for your procedure to start.  Remove all body piercing jewelry and leave at home.  Total time from sign-in until discharge is approximately 2-3 hours.  You should go home directly after your procedure and rest.  You can resume normal activities the  day after your procedure.  The day of your procedure you should not:   Drive   Make legal decisions   Operate machinery   Drink alcohol   Return to work  You will receive specific instructions about eating, activities and medications before you leave.    The above instructions have been reviewed and explained to me by   Karl Bales RN  August 16, 2010 2:00 PM    I fully  understand and can verbalize these instructions _____________________________ Date _________

## 2011-01-23 NOTE — Letter (Signed)
Summary: Patient Notice- Polyp Results  [facility]  [address] [place], [place] [address]   Phone: [phone]  Fax: [phone]        September 08, 2010 MRN: [id number]    Susan Kaufman [address] [place], [place]  [address]    Dear Susan Kaufman,  I am pleased to inform you that the colon polyp(s) removed during your recent colonoscopy was (were) found to be benign (no cancer detected) upon pathologic examination.The polyp consisted of a normal colon tissue, no real polyp tissue  I recommend you have a repeat colonoscopy examination in 10_ years to look for recurrent polyps, as having colon polyps increases your risk for having recurrent polyps or even colon cancer in the future.  Should you develop new or worsening symptoms of abdominal pain, bowel habit changes or bleeding from the rectum or bowels, please schedule an evaluation with either your primary care physician or with me.  Additional information/recommendations:  __ No further action with gastroenterology is needed at this time. Please      follow-up with your primary care physician for your other healthcare      needs.  __ Please call [phone] to schedule a return visit to review your      situation.  __ Please keep your follow-up visit as already scheduled.  __ Continue treatment plan as outlined the day of your exam.  Please call us if you are having persistent problems or have questions about your condition that have not been fully answered at this time.  Sincerely,  Hart Carwin MD  This letter has been electronically signed by your physician.  Appended Document: Patient Notice- Polyp Results letter mailed

## 2011-01-23 NOTE — Letter (Signed)
Summary: MAILED REQUESTED RECORDS TO [employer]  MAILED REQUESTED RECORDS TO [employer]   Imported By: Arta Bruce 01/13/2010 10:41:02  _____________________________________________________________________  External Attachment:    Type:   Image     Comment:   External Document

## 2011-01-23 NOTE — Letter (Signed)
Summary: Lipid Letter  [facility]  [address] [place], [place] [address]   Phone: [phone]  Fax: [phone]    04/25/2010  [name] [address] [place], [place]  [address]  Dear Susan Kaufman:  We have carefully reviewed your last lipid profile from 04/24/2010 and the results are noted below with a summary of recommendations for lipid management.    Cholesterol:       217     Goal: less than 200   HDL "good" Cholesterol:   55     Goal: greater than 40   LDL "bad" Cholesterol:   138     Goal: less than 130   Triglycerides:       121     Goal: less than 150    Choleserol has improved though still not at goal.  Since you are losing weight and monitoring your diet will keep vytorin at the same dose for now.  Continue lifestyle changes.     Current Medications: 1)    Vytorin 10-20 Mg Tabs (Ezetimibe-simvastatin) .... One tablet by mouth nightly for cholesterol 2)    Lisinopril 20 Mg Tabs (Lisinopril) .... One tablet by mouth daily for blood pressure 3)    Bayer Low Strength 81 Mg Tbec (Aspirin) .... One tab daily 4)    Nexium 40 Mg Cpdr (Esomeprazole magnesium) .... One tablet by mouth daily for stomach 5)    Qc Womens Daily Multivitamin  Tabs (Multiple vitamins-minerals) .... One daily 6)    Cvs Iron 325 (65 Fe) Mg Tabs (Ferrous sulfate) .... One tab daily 7)    Hydrochlorothiazide 25 Mg Tabs (Hydrochlorothiazide) .... Take one (1) by mouth daily as needed for swelling 8)    Hydrocodone-acetaminophen 5-325 Mg Tabs (Hydrocodone-acetaminophen) .... One tablet by mouth two times a day as needed for pain 9)    Atenolol 100 Mg Tabs (Atenolol) .... One tablet by mouth nightly 10)    Lexapro 10 Mg Tabs (Escitalopram oxalate) .... One tablet by mouth daily for mood 11)    Alprazolam 0.5 Mg Tabs (Alprazolam) .... One tablet by mouth daily as needed for nerves  If you have any questions, please call. We appreciate being able to work with you.    Sincerely,    [facility] [name] FNP

## 2011-01-23 NOTE — Progress Notes (Signed)
Summary: Query:  Refill lisinopril & atenolol?  Phone Note Outgoing Call   Summary of Call: Refill lisinopril and atenolol?  Last seen 06/2010. Initial call taken by: Dutch Quint RN,  December 01, 2010 4:11 PM  Follow-up for Phone Call        meds refilled Follow-up by: [name] FNP,  December 01, 2010 4:50 PM

## 2011-01-23 NOTE — Letter (Signed)
Summary: [facility]   Imported By: Arta Bruce 04/27/2010 12:50:22  _____________________________________________________________________  External Attachment:    Type:   Image     Comment:   External Document

## 2011-01-23 NOTE — Assessment & Plan Note (Signed)
Summary: Acute - Joint Pain   Vital Signs:  Patient profile:   55 year old female Menstrual status:  hysterectomy Weight:      223.3 pounds BMI:     39.70 Temp:     98.4 degrees F oral Pulse rate:   67 / minute Pulse rhythm:   regular Resp:     20 per minute BP sitting:   126 / 79  (left arm) Cuff size:   large  Vitals Entered By: Levon Hedger (July 20, 2010 11:18 AM) CC: really bad pain all over her body especially in  back shoulder and knees...a couple of years back she had the same kind of pain and was given an uric acid test and was treated for that and now she is having that same pain but alot worse .[name]..used to have glasses but they are broken and has been experiencing decreased vision with bad headaches, Hypertension Management, Lipid Management Is Patient Diabetic? No Pain Assessment Patient in pain? yes       Does patient need assistance? Functional Status Self care Ambulation Normal   Primary Care Provider:  [name] FNP  CC:  really bad pain all over her body especially in  back shoulder and knees...a couple of years back she had the same kind of pain and was given an uric acid test and was treated for that and now she is having that same pain but alot worse .[name]..used to have glasses but they are broken and has been experiencing decreased vision with bad headaches, Hypertension Management, and Lipid Management.  History of Present Illness:  Pt into the office with complaints of pain over her entire body. Pt reports that she had pain so bad 1 week ago that she was not able to get out of the bed. She reports that she had pain like this about 3 years ago in her country and she went to her provider and had an elevated uric acid. Progressive increase in joint pain to a culmination about 1 week ago.  Pain has improved some since last week. Pt has chronic neck and back pain.   Hypertension History:      She denies headache, chest pain, and palpitations.  She  notes no problems with any antihypertensive medication side effects.        Positive major cardiovascular risk factors include hyperlipidemia and hypertension.  Negative major cardiovascular risk factors include female age less than 9 years old and no history of diabetes.        Further assessment for target organ damage reveals no history of ASHD, cardiac end-organ damage (CHF/LVH), stroke/TIA, peripheral vascular disease, renal insufficiency, or hypertensive retinopathy.    Lipid Management History:      Positive NCEP/ATP III risk factors include hypertension.  Negative NCEP/ATP III risk factors include female age less than 67 years old, non-diabetic, no ASHD (atherosclerotic heart disease), no prior stroke/TIA, no peripheral vascular disease, and no history of aortic aneurysm.        The patient states that she knows about the "Therapeutic Lifestyle Change" diet.  Her compliance with the TLC diet is fair.  The patient does not know about adjunctive measures for cholesterol lowering.  She expresses no side effects from her lipid-lowering medication.  The patient denies any symptoms to suggest myopathy or liver disease.      Medications Prior to Update: 1)  Vytorin 10-20 Mg Tabs (Ezetimibe-Simvastatin) .... One Tablet By Mouth Nightly For Cholesterol 2)  Lisinopril 20 Mg Tabs (  Lisinopril) .... One Tablet By Mouth Daily For Blood Pressure 3)  Bayer Low Strength 81 Mg Tbec (Aspirin) .... One Tab Daily 4)  Nexium 40 Mg Cpdr (Esomeprazole Magnesium) .... One Tablet By Mouth Daily For Stomach 5)  Qc Womens Daily Multivitamin  Tabs (Multiple Vitamins-Minerals) .... One Daily 6)  Cvs Iron 325 (65 Fe) Mg Tabs (Ferrous Sulfate) .... One Tab Daily 7)  Hydrochlorothiazide 25 Mg Tabs (Hydrochlorothiazide) .... Take One (1) By Mouth Daily As Needed For Swelling 8)  Hydrocodone-Acetaminophen 5-325 Mg Tabs (Hydrocodone-Acetaminophen) .... One Tablet By Mouth Two Times A Day As Needed For Pain 9)  Atenolol 100  Mg Tabs (Atenolol) .... One Tablet By Mouth Nightly 10)  Lexapro 10 Mg Tabs (Escitalopram Oxalate) .... One Tablet By Mouth Daily For Mood 11)  Alprazolam 0.5 Mg Tabs (Alprazolam) .... One Tablet By Mouth Daily As Needed For Nerves  Allergies (verified): 1)  ! Asa  Review of Systems General:  Denies fever. CV:  Denies chest pain or discomfort. Resp:  Denies cough. GI:  Denies abdominal pain, nausea, and vomiting. MS:  Complains of joint pain.  Physical Exam  General:  alert.  obese Head:  normocephalic.   Lungs:  normal breath sounds.   Heart:  normal rate and regular rhythm.   Abdomen:  normal bowel sounds.   Msk:  active ROM no inflammation or redness in any joints Neurologic:  alert & oriented X3.     Impression & Recommendations:  Problem # 1:  PAIN IN JOINT, MULTIPLE SITES (ICD-719.49) anti-inflammatory started will check labs Orders: T-Uric Acid (Blood) ([id number]) T-Antinuclear Antib (ANA) [id number]) T-C-Reactive Protein [id number]) TLB-Rheumatoid Factor (RA) ([id number]) T-Sed Rate (Automated) ([id number])  Problem # 2:  HYPERTENSION, BENIGN ESSENTIAL (ICD-401.1)  Her updated medication list for this problem includes:    Lisinopril 20 Mg Tabs (Lisinopril) ..... One tablet by mouth daily for blood pressure    Hydrochlorothiazide 25 Mg Tabs (Hydrochlorothiazide) .[name]... Take one (1) by mouth daily as needed for swelling    Atenolol 100 Mg Tabs (Atenolol) ..... One tablet by mouth nightly  Complete Medication List: 1)  Vytorin 10-20 Mg Tabs (Ezetimibe-simvastatin) .... One tablet by mouth nightly for cholesterol 2)  Lisinopril 20 Mg Tabs (Lisinopril) .... One tablet by mouth daily for blood pressure 3)  Bayer Low Strength 81 Mg Tbec (Aspirin) .... One tab daily 4)  Nexium 40 Mg Cpdr (Esomeprazole magnesium) .... One tablet by mouth daily for stomach 5)  Qc Womens Daily Multivitamin Tabs (Multiple vitamins-minerals) .... One daily 6)  Cvs Iron 325 (65 Fe) Mg  Tabs (Ferrous sulfate) .... One tab daily 7)  Hydrochlorothiazide 25 Mg Tabs (Hydrochlorothiazide) .... Take one (1) by mouth daily as needed for swelling 8)  Hydrocodone-acetaminophen 5-325 Mg Tabs (Hydrocodone-acetaminophen) .... One tablet by mouth two times a day as needed for pain 9)  Atenolol 100 Mg Tabs (Atenolol) .... One tablet by mouth nightly 10)  Lexapro 10 Mg Tabs (Escitalopram oxalate) .... One tablet by mouth daily for mood 11)  Alprazolam 0.5 Mg Tabs (Alprazolam) .... One tablet by mouth daily as needed for nerves 12)  Meloxicam 15 Mg Tabs (Meloxicam) .... One tablet by mouth daily  Hypertension Assessment/Plan:      The patient's hypertensive risk group is category B: At least one risk factor (excluding diabetes) with no target organ damage.  Her calculated 10 year risk of coronary heart disease is 7 %.  Today's blood pressure is 126/79.  Her blood pressure goal is <  140/90.  Lipid Assessment/Plan:      Based on NCEP/ATP III, the patient's risk factor category is "0-1 risk factors".  The patient's lipid goals are as follows: Total cholesterol goal is 200; LDL cholesterol goal is 160; HDL cholesterol goal is 40; Triglyceride goal is 150.    Patient Instructions: 1)  You will be informed of the lab results. 2)  Take the Meloxicam 15mg  by mouth by mouth daily x 7 days then as needed. 3)  Follow up as needed Prescriptions: MELOXICAM 15 MG TABS (MELOXICAM) One tablet by mouth daily  #30 x 0   Entered and Authorized by:   [name] FNP   Signed by:   [name] FNP on 07/20/2010   Method used:   Print then Give to Patient   RxID:   [id number] NEXIUM 40 MG CPDR (ESOMEPRAZOLE MAGNESIUM) One tablet by mouth daily for stomach  #30 x 5   Entered and Authorized by:   [name] FNP   Signed by:   [name] FNP on 07/20/2010   Method used:   Faxed to ...       [facility] [address] (retail)       [phone]       [place], [place]  [address]       Ph: [phone]       Fax: [phone]   RxID:   [id number] LEXAPRO 10 MG TABS (ESCITALOPRAM OXALATE) One tablet by mouth daily for mood  #30 x 5   Entered and Authorized by:   [name] FNP   Signed by:   [name] FNP on 07/20/2010   Method used:   Faxed to ...       [facility] [address] (retail)       [phone]       [place], [place]  [address]       Ph: [phone]       Fax: [phone]   RxID:   [id number]

## 2011-01-23 NOTE — Progress Notes (Signed)
Summary: Office Visit//DEPRESSION SCREENING  Office Visit//DEPRESSION SCREENING   Imported By: Arta Bruce 01/13/2010 12:18:58  _____________________________________________________________________  External Attachment:    Type:   Image     Comment:   External Document

## 2011-01-25 NOTE — Progress Notes (Signed)
Summary: Wants rx for joint pain  Phone Note Call from Patient Call back at Home Phone [phone]   Summary of Call: PT'S son called wanting to know if his mother can get a get for an anit-inflammatory medication for her joint swelling. pt is out of town [phone] [facility] [place]). mother does have an appt to f/u with primary 01/11/11. Please contact son with [phone]. Initial call taken by: Hassell Halim CMA,  December 22, 2010 4:18 PM  Follow-up for Phone Call        called pharmacy as per son's request pt was last on meloxicam and hydrocodone Both refilled via phone call son and advise him to check with the pharmacy Follow-up by: [name] FNP,  December 22, 2010 4:27 PM  Additional Follow-up for Phone Call Additional follow up Details #1::        Left message on voicemail for pt.'s son to return call.  Dutch Quint RN  December 22, 2010 5:35 PM  Pt.'s son states she received her medication and is feeling much better.  Dutch Quint RN  December 26, 2010 3:34 PM

## 2011-01-26 ENCOUNTER — Ambulatory Visit: Admit: 2011-01-26 | Payer: Self-pay | Admitting: Nurse Practitioner

## 2011-01-26 ENCOUNTER — Encounter: Payer: Self-pay | Admitting: Nurse Practitioner

## 2011-01-26 ENCOUNTER — Telehealth (INDEPENDENT_AMBULATORY_CARE_PROVIDER_SITE_OTHER): Payer: Self-pay | Admitting: Nurse Practitioner

## 2011-01-26 DIAGNOSIS — K029 Dental caries, unspecified: Secondary | ICD-10-CM | POA: Insufficient documentation

## 2011-01-26 LAB — CONVERTED CEMR LAB
ALT: 12 units/L (ref 0–35)
Alkaline Phosphatase: 71 units/L (ref 39–117)
BUN: 14 mg/dL (ref 6–23)
CO2: 25 meq/L (ref 19–32)
Calcium: 10 mg/dL (ref 8.4–10.5)
Eosinophils Absolute: 0.1 10*3/uL (ref 0.0–0.7)
Eosinophils Relative: 1 % (ref 0–5)
HCT: 40 % (ref 36.0–46.0)
Hemoglobin: 13.2 g/dL (ref 12.0–15.0)
MCHC: 33 g/dL (ref 30.0–36.0)
Neutrophils Relative %: 66 % (ref 43–77)
Platelets: 397 10*3/uL (ref 150–400)
RDW: 14.1 % (ref 11.5–15.5)
TSH: 0.83 microintl units/mL (ref 0.350–4.500)
Total Bilirubin: 0.4 mg/dL (ref 0.3–1.2)
Total Protein: 7.9 g/dL (ref 6.0–8.3)
WBC: 9.5 10*3/uL (ref 4.0–10.5)

## 2011-01-29 ENCOUNTER — Encounter (INDEPENDENT_AMBULATORY_CARE_PROVIDER_SITE_OTHER): Payer: Self-pay | Admitting: Nurse Practitioner

## 2011-01-31 NOTE — Assessment & Plan Note (Signed)
Summary: Chronic issues   Vital Signs:  Patient profile:   55 year old female Menstrual status:  hysterectomy Weight:      227.9 pounds Temp:     97.4 degrees F oral Pulse rate:   68 / minute Pulse rhythm:   regular Resp:     20 per minute BP sitting:   132 / 100  (left arm) Cuff size:   large  Vitals Entered By: Levon Hedger (January 26, 2011 2:24 PM) CC: bad tooth pain that is causing her not to get any sleep....cough x 7-8 days, Hypertension Management, Lipid Management, Abdominal Pain Is Patient Diabetic? No Pain Assessment Patient in pain? yes     Location: knees, back, throat  Does patient need assistance? Functional Status Self care Ambulation Normal   Primary Care Provider:  [name] FNP  CC:  bad tooth pain that is causing her not to get any sleep....cough x 7-8 days, Hypertension Management, Lipid Management, and Abdominal Pain.  History of Present Illness:  Pt into the office for chronic f/u. She has been out of town to [place] and stayed for about 1 month. She admits that on her trip to and from [place] her right knee is very swollen and hot.  Pt present today with all her medications.  She completed her supply since she was out of the country for many years.  Son present with pt today during the exam who serves as her interpreter.  Dyspepsia History:      She has no alarm features of dyspepsia including no history of melena, hematochezia, dysphagia, persistent vomiting, or involuntary weight loss > 5%.  There is a prior history of GERD.  The patient does not have a prior history of documented ulcer disease.  The dominant symptom is not heartburn or acid reflux.  An H-2 blocker medication is not currently being taken.    Hypertension History:      She denies headache, chest pain, and palpitations.        Positive major cardiovascular risk factors include hyperlipidemia and hypertension.  Negative major cardiovascular risk factors include female age  less than 50 years old, no history of diabetes, and non-tobacco-user status.        Further assessment for target organ damage reveals no history of ASHD, cardiac end-organ damage (CHF/LVH), stroke/TIA, peripheral vascular disease, renal insufficiency, or hypertensive retinopathy.    Lipid Management History:      Positive NCEP/ATP III risk factors include hypertension.  Negative NCEP/ATP III risk factors include female age less than 27 years old, non-diabetic, non-tobacco-user status, no ASHD (atherosclerotic heart disease), no prior stroke/TIA, no peripheral vascular disease, and no history of aortic aneurysm.        The patient states that she knows about the "Therapeutic Lifestyle Change" diet.  Her compliance with the TLC diet is fair.  She expresses no side effects from her lipid-lowering medication.  The patient denies any symptoms to suggest myopathy or liver disease.       Habits & Providers  Alcohol-Tobacco-Diet     Alcohol drinks/day: 0     Tobacco Status: never  Exercise-Depression-Behavior     Does Patient Exercise: no     Drug Use: never  Medications Prior to Update: 1)  Vytorin 10-20 Mg Tabs (Ezetimibe-Simvastatin) .... One Tablet By Mouth Nightly For Cholesterol 2)  Lisinopril 20 Mg Tabs (Lisinopril) .... One Tablet By Mouth Daily For Blood Pressure 3)  Bayer Low Strength 81 Mg Tbec (Aspirin) .... One  Tab Daily 4)  Nexium 40 Mg Cpdr (Esomeprazole Magnesium) .... One Tablet By Mouth Daily For Stomach 5)  Qc Womens Daily Multivitamin  Tabs (Multiple Vitamins-Minerals) .... One Daily 6)  Cvs Iron 325 (65 Fe) Mg Tabs (Ferrous Sulfate) .... One Tab Daily 7)  Hydrochlorothiazide 25 Mg Tabs (Hydrochlorothiazide) .... Take One (1) By Mouth Daily As Needed For Swelling 8)  Hydrocodone-Acetaminophen 5-325 Mg Tabs (Hydrocodone-Acetaminophen) .... One Tablet By Mouth Two Times A Day As Needed For Pain 9)  Atenolol 100 Mg Tabs (Atenolol) .... One Tablet By Mouth Nightly 10)  Lexapro  10 Mg Tabs (Escitalopram Oxalate) .... One Tablet By Mouth Daily For Mood 11)  Alprazolam 0.5 Mg Tabs (Alprazolam) .... One Tablet By Mouth Daily As Needed For Nerves 12)  Meloxicam 15 Mg Tabs (Meloxicam) .... One Tablet By Mouth Daily  Current Medications (verified): 1)  Vytorin 10-20 Mg Tabs (Ezetimibe-Simvastatin) .... One Tablet By Mouth Nightly For Cholesterol 2)  Lisinopril 20 Mg Tabs (Lisinopril) .... One Tablet By Mouth Daily For Blood Pressure 3)  Bayer Low Strength 81 Mg Tbec (Aspirin) .... One Tab Daily 4)  Nexium 40 Mg Cpdr (Esomeprazole Magnesium) .... One Tablet By Mouth Daily For Stomach 5)  Qc Womens Daily Multivitamin  Tabs (Multiple Vitamins-Minerals) .... One Daily 6)  Cvs Iron 325 (65 Fe) Mg Tabs (Ferrous Sulfate) .... One Tab Daily 7)  Hydrochlorothiazide 25 Mg Tabs (Hydrochlorothiazide) .... Take One (1) By Mouth Daily As Needed For Swelling 8)  Hydrocodone-Acetaminophen 5-325 Mg Tabs (Hydrocodone-Acetaminophen) .... One Tablet By Mouth Two Times A Day As Needed For Pain 9)  Atenolol 100 Mg Tabs (Atenolol) .... One Tablet By Mouth Nightly 10)  Lexapro 10 Mg Tabs (Escitalopram Oxalate) .... One Tablet By Mouth Daily For Mood 11)  Alprazolam 0.5 Mg Tabs (Alprazolam) .... One Tablet By Mouth Daily As Needed For Nerves 12)  Meloxicam 15 Mg Tabs (Meloxicam) .... One Tablet By Mouth Daily  Allergies (verified): 1)  ! Asa  Social History: Smoking Status:  never Drug Use:  never Does Patient Exercise:  no  Review of Systems General:  Denies fever. CV:  Denies chest pain or discomfort. Resp:  Denies cough. GI:  Denies abdominal pain, nausea, and vomiting. MS:  Complains of joint pain and low back pain.  Physical Exam  General:  alert.  obese Head:  normocephalic.   Lungs:  normal breath sounds.   Heart:  normal rate and regular rhythm.   Abdomen:  normal bowel sounds.     Knee Exam  Knee Exam:    Right:    Inspection:  Abnormal    Stability:  stable     Tenderness:  no    Swelling:  diffuse    Erythema:  no    Left:    Inspection:  Abnormal    Stability:  stable    Tenderness:  no    Swelling:  diffuse    Erythema:  no    arthritic changes bilaterally   Impression & Recommendations:  Problem # 1:  HYPERTENSION, BENIGN ESSENTIAL (ICD-401.1) BP is doing well Her updated medication list for this problem includes:    Lisinopril 20 Mg Tabs (Lisinopril) ..... One tablet by mouth daily for blood pressure    Hydrochlorothiazide 25 Mg Tabs (Hydrochlorothiazide) .[name]... Take one (1) by mouth daily as needed for swelling    Atenolol 100 Mg Tabs (Atenolol) ..... One tablet by mouth nightly  Orders: T-Comprehensive Metabolic Panel ([id number]) UA Dipstick w/o Micro (manual) (10272)  T-Urine Microalbumin w/creat. ratio [id number])  Problem # 2:  PAIN IN JOINT, MULTIPLE SITES (ICD-719.49) reviewed with pt that several issues are affecting her chronic joint pain 1.  lack of actvity 2.  weight Orders: Physical Therapy Referral (PT)  Problem # 3:  HYPERLIPIDEMIA (ICD-272.4)  Her updated medication list for this problem includes:    Vytorin 10-20 Mg Tabs (Ezetimibe-simvastatin) ..... One tablet by mouth nightly for cholesterol  Problem # 4:  BACK PAIN (ICD-724.5) ongoing but again this would improve if pt would change her lifestyle Her updated medication list for this problem includes:    Bayer Low Strength 81 Mg Tbec (Aspirin) ..... One tab daily    Hydrocodone-acetaminophen 5-325 Mg Tabs (Hydrocodone-acetaminophen) ..... One tablet by mouth two times a day as needed for pain    Meloxicam 15 Mg Tabs (Meloxicam) ..... One tablet by mouth daily    Percocet 5-325 Mg Tabs (Oxycodone-acetaminophen) ..... One tablet by mouth daily as needed for pain  Problem # 5:  ANEMIA (ICD-285.9) will check labs today Her updated medication list for this problem includes:    Cvs Iron 325 (65 Fe) Mg Tabs (Ferrous sulfate) ..... One tab  daily  Orders: T-CBC w/Diff ([id number]) T-TSH [phone])  Problem # 6:  DENTAL CARIES (ICD-521.00) will refer to dental clinic - will try urgent referral Orders: Dental Referral (Dentist)  Complete Medication List: 1)  Vytorin 10-20 Mg Tabs (Ezetimibe-simvastatin) .... One tablet by mouth nightly for cholesterol 2)  Lisinopril 20 Mg Tabs (Lisinopril) .... One tablet by mouth daily for blood pressure 3)  Bayer Low Strength 81 Mg Tbec (Aspirin) .... One tab daily 4)  Nexium 40 Mg Cpdr (Esomeprazole magnesium) .... One tablet by mouth daily for stomach 5)  Qc Womens Daily Multivitamin Tabs (Multiple vitamins-minerals) .... One daily 6)  Cvs Iron 325 (65 Fe) Mg Tabs (Ferrous sulfate) .... One tab daily 7)  Hydrochlorothiazide 25 Mg Tabs (Hydrochlorothiazide) .... Take one (1) by mouth daily as needed for swelling 8)  Hydrocodone-acetaminophen 5-325 Mg Tabs (Hydrocodone-acetaminophen) .... One tablet by mouth two times a day as needed for pain 9)  Atenolol 100 Mg Tabs (Atenolol) .... One tablet by mouth nightly 10)  Lexapro 10 Mg Tabs (Escitalopram oxalate) .... One tablet by mouth daily for mood 11)  Alprazolam 0.5 Mg Tabs (Alprazolam) .... One tablet by mouth daily as needed for nerves 12)  Meloxicam 15 Mg Tabs (Meloxicam) .... One tablet by mouth daily 13)  Percocet 5-325 Mg Tabs (Oxycodone-acetaminophen) .... One tablet by mouth daily as needed for pain 14)  Penicillin V Potassium 500 Mg Tabs (Penicillin v potassium) .... One tablet by mouth three times a day for infection  Hypertension Assessment/Plan:      The patient's hypertensive risk group is category B: At least one risk factor (excluding diabetes) with no target organ damage.  Her calculated 10 year risk of coronary heart disease is 11 %.  Today's blood pressure is 132/100.  Her blood pressure goal is < 140/90.  Lipid Assessment/Plan:      Based on NCEP/ATP III, the patient's risk factor category is "2 or more risk factors  and a calculated 10 year CAD risk of < 20%".  The patient's lipid goals are as follows: Total cholesterol goal is 200; LDL cholesterol goal is 160; HDL cholesterol goal is 40; Triglyceride goal is 150.    Patient Instructions: 1)  You will be referred to physical therapy so you can learn what exercises you can do.  This office will call TASHI with time/date of the appointment 2)  Dental problems - take the penicilin 500mg  by mouth three times a day for 10 days.  This office will send the referral to the dental clinic with instructions to call TASHI with time/date of the appointment.  3)  Joint pain - you have some degree of joint pain daily.  However you are NOT doing enough activity.  You need to push your yourself and increase your exercise (you should be doing a certain amount of walking DAILY)  Push yourself.   4)  Cough - May take over the counter coricidan. 5)  Follow up in 2 months for chronic issues. Prescriptions: HYDROCHLOROTHIAZIDE 25 MG TABS (HYDROCHLOROTHIAZIDE) Take one (1) by mouth daily as needed for swelling  #30 x 1   Entered and Authorized by:   [name] FNP   Signed by:   [name] FNP on 01/26/2011   Method used:   Electronically to        [facility] # [phone]* (retail)       [address]       [place], [place]  [address]       Ph: [phone]       Fax: [phone]   RxID:   [id number] PENICILLIN V POTASSIUM 500 MG TABS (PENICILLIN V POTASSIUM) One tablet by mouth three times a day for infection  #30 x 0   Entered and Authorized by:   [name] FNP   Signed by:   [name] FNP on 01/26/2011   Method used:   Print then Give to Patient   RxID:   [id number] PERCOCET 5-325 MG TABS (OXYCODONE-ACETAMINOPHEN) One tablet by mouth daily as needed for pain  #30 x 0   Entered and Authorized by:   [name] FNP   Signed by:   [name] FNP on 01/26/2011   Method used:   Print then Give to Patient    RxID:   [id number] MELOXICAM 15 MG TABS (MELOXICAM) One tablet by mouth daily  #30 x 1   Entered and Authorized by:   [name] FNP   Signed by:   [name] FNP on 01/26/2011   Method used:   Print then Give to Patient   RxID:   [id number] LEXAPRO 10 MG TABS (ESCITALOPRAM OXALATE) One tablet by mouth daily for mood  #30 x 11   Entered and Authorized by:   [name] FNP   Signed by:   [name] FNP on 01/26/2011   Method used:   Faxed to ...       [facility] (retail)       [address] [place], [place]  [address]       Ph: [phone]       Fax: [phone]   RxID:   [id number] NEXIUM 40 MG CPDR (ESOMEPRAZOLE MAGNESIUM) One tablet by mouth daily for stomach  #30 x 11   Entered and Authorized by:   [name] FNP   Signed by:   [name] FNP on 01/26/2011   Method used:   Faxed to ...       [facility] (retail)       [address] [place], [place]  [address]       Ph: [phone]  Fax: [phone]   RxID:   [id number] VYTORIN 10-20 MG TABS (EZETIMIBE-SIMVASTATIN) One tablet by mouth nightly for cholesterol  #30 x 11   Entered and Authorized by:   [name] FNP   Signed by:   [name] FNP on 01/26/2011   Method used:   Faxed to ...       [facility] (retail)       [address] [place], [place]  [address]       Ph: [phone]       Fax: [phone]   RxID:   [id number]    Orders Added: 1)  Est. Patient Level IV [88416] 2)  T-Comprehensive Metabolic Panel [80053-22900] 3)  T-CBC w/Diff [60630-16010] 4)  T-TSH [93235-57322] 5)  UA Dipstick w/o Micro (manual) [81002] 6)  T-Urine Microalbumin w/creat. ratio [[id number]] 7)  Dental Referral [Dentist] 8)  Physical Therapy Referral [PT]

## 2011-02-08 NOTE — Letter (Signed)
Summary: DENTAL REFERRAL/URGENT  DENTAL REFERRAL/URGENT   Imported By: Arta Bruce 01/29/2011 13:45:04  _____________________________________________________________________  External Attachment:    Type:   Image     Comment:   External Document

## 2011-02-08 NOTE — Letter (Signed)
Summary: *HSN Results Follow up  [facility]  [address] [place], [place] [address]   Phone: [phone]  Fax: [phone]      01/29/2011   Susan Kaufman [address] [facility] [address] [place], [place]  [address]   Dear  [name],                            ____S.Drinkard,FNP   ____D. Gore,FNP       ____B. McPherson,MD   ____V. Rankins,MD    ____E. Mulberry,MD    __X__N. Daphine Deutscher, FNP  ____D. Reche Dixon, MD    ____K. Philipp Deputy, MD    ____Other     This letter is to inform you that your recent test(s):  _______Pap Smear    ___X____Lab Test     _______X-ray    ___X____ is within acceptable limits  _______ requires a medication change  _______ requires a follow-up lab visit  _______ requires a follow-up visit with your provider   Comments: Labs done during recent office are normal.       _________________________________________________________ If you have any questions, please contact our office [phone].                    Sincerely,    [name] FNP [facility]

## 2011-02-08 NOTE — Letter (Signed)
Summary: FAXED DENTAL REFERRAL//URGENT  FAXED DENTAL REFERRAL//URGENT   Imported By: Arta Bruce 01/29/2011 09:49:36  _____________________________________________________________________  External Attachment:    Type:   Image     Comment:   External Document

## 2011-02-08 NOTE — Progress Notes (Signed)
Summary: Physical Therapy referral  Phone Note Outgoing Call   Summary of Call: Refer to the physical therapy - call son  TASHI at [phone] with appt he would like to know if there are any appointments available in the evening after 6pm when he gets off work Initial call taken by: [name] FNP,  January 26, 2011 3:35 PM  Follow-up for Phone Call        SEND A REFERRAL TO [facility] [address] PH # [phone]  WAITING FOR AN APPT.Marland KitchenCheryll Dessert  January 29, 2011 11:44 AM

## 2011-02-12 ENCOUNTER — Ambulatory Visit: Payer: Self-pay | Attending: Nurse Practitioner | Admitting: Physical Therapy

## 2011-02-12 ENCOUNTER — Encounter (INDEPENDENT_AMBULATORY_CARE_PROVIDER_SITE_OTHER): Payer: Self-pay | Admitting: Nurse Practitioner

## 2011-02-12 DIAGNOSIS — R5381 Other malaise: Secondary | ICD-10-CM | POA: Insufficient documentation

## 2011-02-12 DIAGNOSIS — M2560 Stiffness of unspecified joint, not elsewhere classified: Secondary | ICD-10-CM | POA: Insufficient documentation

## 2011-02-12 DIAGNOSIS — IMO0001 Reserved for inherently not codable concepts without codable children: Secondary | ICD-10-CM | POA: Insufficient documentation

## 2011-02-12 DIAGNOSIS — M2550 Pain in unspecified joint: Secondary | ICD-10-CM | POA: Insufficient documentation

## 2011-02-15 ENCOUNTER — Telehealth (INDEPENDENT_AMBULATORY_CARE_PROVIDER_SITE_OTHER): Payer: Self-pay | Admitting: Nurse Practitioner

## 2011-02-20 ENCOUNTER — Telehealth (INDEPENDENT_AMBULATORY_CARE_PROVIDER_SITE_OTHER): Payer: Self-pay | Admitting: *Deleted

## 2011-02-20 NOTE — Miscellaneous (Signed)
Summary: Rehab Report//INITIAL SUMMARY  Rehab Report//INITIAL SUMMARY   Imported By: Arta Bruce 02/14/2011 08:55:41  _____________________________________________________________________  External Attachment:    Type:   Image     Comment:   External Document

## 2011-02-26 ENCOUNTER — Ambulatory Visit: Payer: Self-pay | Attending: Nurse Practitioner

## 2011-02-26 DIAGNOSIS — M2560 Stiffness of unspecified joint, not elsewhere classified: Secondary | ICD-10-CM | POA: Insufficient documentation

## 2011-02-26 DIAGNOSIS — IMO0001 Reserved for inherently not codable concepts without codable children: Secondary | ICD-10-CM | POA: Insufficient documentation

## 2011-02-26 DIAGNOSIS — R5381 Other malaise: Secondary | ICD-10-CM | POA: Insufficient documentation

## 2011-02-26 DIAGNOSIS — M2550 Pain in unspecified joint: Secondary | ICD-10-CM | POA: Insufficient documentation

## 2011-03-01 ENCOUNTER — Ambulatory Visit: Payer: Self-pay | Admitting: Physical Therapy

## 2011-03-01 NOTE — Progress Notes (Signed)
Summary: Lost Rx  Phone Note Call from Patient   Summary of Call: PT LOST HER WRITTEN SCRIPT FOR MELOXICAM/SON ASK IF YOU COULD JUST CALL IT IN AT [facility] [address] Initial call taken by: Arta Bruce,  February 15, 2011 2:42 PM  Follow-up for Phone Call        OK to call it in?  Dutch Quint RN  February 15, 2011 3:04 PM   Additional Follow-up for Phone Call Additional follow up Details #1::        rx sent electronically notify pt/son to check with the pharmacy Additional Follow-up by: [name] FNP,  February 15, 2011 3:35 PM    Additional Follow-up for Phone Call Additional follow up Details #2::    Left message on voicemail for pt. to return call.  Dutch Quint RN  February 16, 2011 5:13 PM  Left message on voicemail for pt. to return call.  Dutch Quint RN  February 19, 2011 4:08 PM  Son notified -- states he already picked up Rx.  Dutch Quint RN  February 20, 2011 4:59 PM   Prescriptions: MELOXICAM 15 MG TABS (MELOXICAM) One tablet by mouth daily  #30 x 1   Entered and Authorized by:   [name] FNP   Signed by:   [name] FNP on 02/15/2011   Method used:   Electronically to        [facility] [address] (retail)       [phone]       [place], [place]  [address]       Ph: [phone]       Fax: [phone]   RxID:   [id number]

## 2011-03-01 NOTE — Progress Notes (Signed)
Summary: Needs dental appt. from referral ASAP  Phone Note Call from Patient   Summary of Call: Having a lot in pain in her tooth -- was supposed to be urgent or priority for dental appt.  Son wants to know how soon? Initial call taken by: Dutch Quint RN,  February 20, 2011 4:58 PM  Follow-up for Phone Call        Althia Forts TO [facility] D[facility]  [facility] # [phone] ADDRESS [address]  THEY WILL CONTACT THE PT TO MADE AN APPT.Marland KitchenCheryll Dessert  January 29, 2011 11:42 AM

## 2011-03-07 ENCOUNTER — Ambulatory Visit: Payer: Self-pay | Admitting: Rehabilitation

## 2011-03-08 ENCOUNTER — Ambulatory Visit: Payer: Self-pay | Admitting: Physical Therapy

## 2011-03-12 ENCOUNTER — Ambulatory Visit: Payer: Self-pay

## 2011-03-15 ENCOUNTER — Ambulatory Visit: Payer: Self-pay | Admitting: Physical Therapy

## 2011-03-20 ENCOUNTER — Ambulatory Visit: Payer: Self-pay | Admitting: Physical Therapy

## 2011-03-20 ENCOUNTER — Emergency Department (HOSPITAL_BASED_OUTPATIENT_CLINIC_OR_DEPARTMENT_OTHER)
Admission: EM | Admit: 2011-03-20 | Discharge: 2011-03-20 | Disposition: A | Discharge status: Home / Self Care | Payer: Self-pay | Attending: Emergency Medicine | Admitting: Emergency Medicine

## 2011-03-20 DIAGNOSIS — E7800 Pure hypercholesterolemia, unspecified: Secondary | ICD-10-CM | POA: Insufficient documentation

## 2011-03-20 DIAGNOSIS — K089 Disorder of teeth and supporting structures, unspecified: Secondary | ICD-10-CM | POA: Insufficient documentation

## 2011-03-20 DIAGNOSIS — G8929 Other chronic pain: Secondary | ICD-10-CM | POA: Insufficient documentation

## 2011-03-20 DIAGNOSIS — M542 Cervicalgia: Secondary | ICD-10-CM | POA: Insufficient documentation

## 2011-03-20 DIAGNOSIS — I10 Essential (primary) hypertension: Secondary | ICD-10-CM | POA: Insufficient documentation

## 2011-03-20 DIAGNOSIS — Z79899 Other long term (current) drug therapy: Secondary | ICD-10-CM | POA: Insufficient documentation

## 2011-03-20 DIAGNOSIS — M549 Dorsalgia, unspecified: Secondary | ICD-10-CM | POA: Insufficient documentation

## 2011-03-22 ENCOUNTER — Ambulatory Visit: Payer: Self-pay | Admitting: Physical Therapy

## 2011-03-28 ENCOUNTER — Ambulatory Visit: Payer: Self-pay | Attending: Nurse Practitioner | Admitting: Physical Therapy

## 2011-03-28 DIAGNOSIS — IMO0001 Reserved for inherently not codable concepts without codable children: Secondary | ICD-10-CM | POA: Insufficient documentation

## 2011-03-28 DIAGNOSIS — M2550 Pain in unspecified joint: Secondary | ICD-10-CM | POA: Insufficient documentation

## 2011-03-28 DIAGNOSIS — M2560 Stiffness of unspecified joint, not elsewhere classified: Secondary | ICD-10-CM | POA: Insufficient documentation

## 2011-03-28 DIAGNOSIS — R5381 Other malaise: Secondary | ICD-10-CM | POA: Insufficient documentation

## 2011-03-29 ENCOUNTER — Emergency Department (HOSPITAL_BASED_OUTPATIENT_CLINIC_OR_DEPARTMENT_OTHER)
Admission: EM | Admit: 2011-03-29 | Discharge: 2011-03-29 | Disposition: A | Discharge status: Home / Self Care | Payer: Self-pay | Attending: Emergency Medicine | Admitting: Emergency Medicine

## 2011-03-29 ENCOUNTER — Ambulatory Visit: Payer: Self-pay | Admitting: Physical Therapy

## 2011-03-29 ENCOUNTER — Emergency Department (INDEPENDENT_AMBULATORY_CARE_PROVIDER_SITE_OTHER): Payer: Self-pay

## 2011-03-29 DIAGNOSIS — E7800 Pure hypercholesterolemia, unspecified: Secondary | ICD-10-CM | POA: Insufficient documentation

## 2011-03-29 DIAGNOSIS — M549 Dorsalgia, unspecified: Secondary | ICD-10-CM | POA: Insufficient documentation

## 2011-03-29 DIAGNOSIS — R51 Headache: Secondary | ICD-10-CM

## 2011-03-29 DIAGNOSIS — R42 Dizziness and giddiness: Secondary | ICD-10-CM

## 2011-03-29 DIAGNOSIS — Z79899 Other long term (current) drug therapy: Secondary | ICD-10-CM | POA: Insufficient documentation

## 2011-03-29 DIAGNOSIS — K089 Disorder of teeth and supporting structures, unspecified: Secondary | ICD-10-CM | POA: Insufficient documentation

## 2011-03-29 DIAGNOSIS — I10 Essential (primary) hypertension: Secondary | ICD-10-CM | POA: Insufficient documentation

## 2011-03-29 DIAGNOSIS — M542 Cervicalgia: Secondary | ICD-10-CM | POA: Insufficient documentation

## 2011-03-29 DIAGNOSIS — G8929 Other chronic pain: Secondary | ICD-10-CM | POA: Insufficient documentation

## 2011-03-29 LAB — BASIC METABOLIC PANEL
BUN: 13 mg/dL (ref 6–23)
CO2: 26 mEq/L (ref 19–32)
Calcium: 9.7 mg/dL (ref 8.4–10.5)
Creatinine, Ser: 0.9 mg/dL (ref 0.4–1.2)

## 2011-03-29 LAB — DIFFERENTIAL
Basophils Relative: 0 % (ref 0–1)
Eosinophils Absolute: 0.1 10*3/uL (ref 0.0–0.7)
Eosinophils Relative: 2 % (ref 0–5)
Lymphocytes Relative: 27 % (ref 12–46)
Monocytes Absolute: 0.4 10*3/uL (ref 0.1–1.0)
Monocytes Relative: 7 % (ref 3–12)
Neutro Abs: 4 10*3/uL (ref 1.7–7.7)

## 2011-03-29 LAB — CBC
MCH: 25 pg — ABNORMAL LOW (ref 26.0–34.0)
MCV: 75.5 fL — ABNORMAL LOW (ref 78.0–100.0)
RBC: 5.03 MIL/uL (ref 3.87–5.11)
RDW: 14.6 % (ref 11.5–15.5)

## 2011-03-31 LAB — TYPE AND SCREEN
ABO/RH(D): O POS
Antibody Screen: NEGATIVE

## 2011-03-31 LAB — CBC
Hemoglobin: 11.6 g/dL — ABNORMAL LOW (ref 12.0–15.0)
Hemoglobin: 12.5 g/dL (ref 12.0–15.0)
Platelets: 365 10*3/uL (ref 150–400)
RBC: 4.37 MIL/uL (ref 3.87–5.11)
RDW: 15.8 % — ABNORMAL HIGH (ref 11.5–15.5)
WBC: 8.7 10*3/uL (ref 4.0–10.5)
WBC: 9.9 10*3/uL (ref 4.0–10.5)

## 2011-03-31 LAB — BASIC METABOLIC PANEL
CO2: 24 mEq/L (ref 19–32)
Calcium: 8.8 mg/dL (ref 8.4–10.5)
Calcium: 9.5 mg/dL (ref 8.4–10.5)
Creatinine, Ser: 0.81 mg/dL (ref 0.4–1.2)
GFR calc Af Amer: >60 mL/min (ref >60)
GFR calc non Af Amer: 58 mL/min — ABNORMAL LOW (ref >60)
GFR calc non Af Amer: >60 mL/min (ref >60)
Sodium: 131 mEq/L — ABNORMAL LOW (ref 135–145)
Sodium: 135 mEq/L (ref 135–145)

## 2011-04-01 LAB — CBC
HCT: 41.3 % (ref 36.0–46.0)
Hemoglobin: 13.9 g/dL (ref 12.0–15.0)
RDW: 16 % — ABNORMAL HIGH (ref 11.5–15.5)

## 2011-04-01 LAB — BASIC METABOLIC PANEL
Chloride: 103 mEq/L (ref 96–112)
GFR calc non Af Amer: >60 mL/min (ref >60)
Glucose, Bld: 76 mg/dL (ref 70–99)
Potassium: 4.7 mEq/L (ref 3.5–5.1)
Sodium: 139 mEq/L (ref 135–145)

## 2011-04-02 ENCOUNTER — Ambulatory Visit: Payer: Self-pay | Admitting: Rehabilitation

## 2011-04-05 ENCOUNTER — Ambulatory Visit: Payer: Self-pay | Admitting: Rehabilitation

## 2011-04-05 LAB — CBC
Hemoglobin: 13.4 g/dL (ref 12.0–15.0)
MCHC: 33.4 g/dL (ref 30.0–36.0)
MCV: 82.3 fL (ref 78.0–100.0)
RBC: 4.86 MIL/uL (ref 3.87–5.11)

## 2011-04-05 LAB — DIFFERENTIAL
Basophils Relative: 0 % (ref 0–1)
Eosinophils Absolute: 0.1 10*3/uL (ref 0.0–0.7)
Monocytes Absolute: 0.6 10*3/uL (ref 0.1–1.0)
Monocytes Relative: 5 % (ref 3–12)
Neutrophils Relative %: 70 % (ref 43–77)

## 2011-04-09 ENCOUNTER — Ambulatory Visit: Payer: Self-pay | Admitting: Rehabilitation

## 2011-04-12 ENCOUNTER — Encounter: Payer: Self-pay | Admitting: Rehabilitation

## 2011-05-08 NOTE — Cardiovascular Report (Signed)
Susan Kaufman, Susan Kaufman NO.:  [id number]   MEDICAL RECORD NO.:  [id number]          PATIENT TYPE:  WOC   LOCATION:  WOC                          FACILITY:  [facility]   PHYSICIAN:  Verne Carrow, MDDATE OF BIRTH:  19-Aug-1956   DATE OF PROCEDURE:  07/22/2009  DATE OF DISCHARGE:  07/01/2009                            CARDIAC CATHETERIZATION   PRIMARY CARE PHYSICIAN:  [name], family nurse practitioner.   PRIMARY CARDIOLOGIST:  Verne Carrow, MD   PROCEDURES PERFORMED:  1. Left heart catheterization.  2. Selective coronary angiography.  3. Left ventricular angiogram.   OPERATOR:  Verne Carrow, MD   INDICATIONS:  Chest pain and an abnormal stress Myoview in the 55-year-  old patient with a history of hypertension, hyperlipidemia, and recent  episodes of chest pain that were concerning enough to warrant nuclear  stress Myoview, which showed possible anterior and anteroapical  ischemia.  The patient was referred for a diagnostic left heart  catheterization today.   PROCEDURE IN DETAIL:  The patient was brought to the inpatient cardiac  catheterization laboratory after signing informed consent for the  procedure.  The right groin was prepped and draped in sterile fashion.  Lidocaine 1% was used for local anesthesia.  A 5-French sheath was  inserted into the right femoral artery without difficulty.  Standard  diagnostic catheters were used to perform selective coronary  angiography.  A pigtail catheter was used to perform left ventricular  angiogram.  The patient tolerated the procedure well and was taken to  the holding area in stable condition.   HEMODYNAMIC FINDINGS:  Central aortic pressure 159/76.  Left ventricular  pressure 167/3.  Left ventricular end-diastolic pressure 9.   ANGIOGRAPHIC FINDINGS:  1. The left main coronary artery had no evidence of disease.  2. The left anterior descending is a large vessel that courses to the    apex and gives off a large first diagonal branch and a smaller      second diagonal branch.  There is also a large septal perforator.      There is no evidence of disease in the system.  3. The circumflex artery gives off an early ramus intermedius branch,      a small-to-moderate sized second obtuse marginal branch, and a      large third obtuse marginal branch.  The AV groove circumflex      vessel becomes small after the takeoff of the last obtuse marginal      branch.  There is no evidence of obstructive disease in this      system.  4. The right coronary artery is a large dominant vessel that gives off      posterior descending artery and a posterolateral branch.  There is      no evidence of obstructive disease in this vessel.  5. Left ventricular angiogram was performed in the RAO projection and      shows normal left ventricular systolic function with no wall motion      abnormalities.  Ejection fraction was estimated at 65%.   IMPRESSION:  1. No angiographic evidence of coronary  artery disease.  2. Normal left ventricular systolic function.   RECOMMENDATIONS:  No further cardiac workup is indicated at this time.  The patient will follow up with me in my office in [place] at 1:45 on  August 03, 2009.  We will have 4 hours of bedrest here in the hospital  and she will be discharged to home after her bedrest.      Verne Carrow, MD  Electronically Signed     CM/MEDQ  D:  07/22/2009  T:  07/23/2009  Job:  [id number]   cc:   [name], FNP

## 2011-05-08 NOTE — Group Therapy Note (Signed)
NAMEMarland Kitchen  MALYAH, OHLRICH NO.:  [id number]   MEDICAL RECORD NO.:  [id number]          PATIENT TYPE:  WOC   LOCATION:  [facility]                   FACILITY:  [facility]   PHYSICIAN:  Scheryl Darter, MD       DATE OF BIRTH:  1956/12/07   DATE OF SERVICE:  04/01/2009                                  CLINIC NOTE   Ms. Rockhold is a 55 year old female, who is here for followup for  dysfunction uterine bleeding and fibroids.  She has previously been  evaluated for this problem to include an ultrasound which shows a 18.6  cm x 10.2 x 21.9 cm uterus with multiple fibroids and endometrial  biopsy, which revealed benign proliferative and secretory endometrium  with no atypia or malignancy.  Pap smear was also done recently which  was negative.  The patient reports that she continues to bleed, and over  the last 3 weeks has had moderate bleeding, multiple pads per day with  clots on a daily basis.  She also experiences some mild-to-moderate  cramping on a daily basis as well.  She was initially scheduled for TAH-  BSO, however, she was pending approval for Depo-Lupron injection, as  this would be a preferable option to proceed with before surgery.  The  Depo-Lupron injections have been approved and are available for her  today.   PHYSICAL EXAMINATION:  VITAL SIGNS:  Her blood pressure today is 135/71,  heart rate 73, respiratory rate is 20, and temperature is 98.3.  She is  5 feet 4 inches tall and weighs 225 pounds.  HEART:  Regular and no murmur.  LUNGS:  Clear to auscultation.  ABDOMEN:  Soft.  It is diffuse, mild tenderness to palpation.  Her  uterus is enlarged to level of the umbilicus.  GYN:  Deferred at this time.   A CBC was done which revealed a white count of 11.7, a  hemoglobin/hematocrit 12.8 and 38.8 respectively, and platelet count  344.   ASSESSMENT/PLAN:  Dysfunctional uterine bleeding secondary to  significantly enlarged fibroid uterus.  I recommended the  patient to  attempt to cut down on her oxycodone use and instead try to use  ibuprofen for her pain control.  Additionally, she received her first  dose of Depo-Lupron today 11.25 mg.  We had a lengthy discussion  regarding the side effects of this medication and the patient will  return in 3 months for a repeat injection.  The goal is to attempt to  treat her uterus and fibroids to make the total abdominal hysterectomy  and bilateral salpingo-oophorectomy technically a bit more easy.  The  patient will return in 3 months for her repeat Depo-Lupron shot as well  as for reevaluation.     ______________________________  Odie Sera, D.O.    ______________________________  Scheryl Darter, MD   MC/MEDQ  D:  04/01/2009  T:  04/02/2009  Job:  [id number]

## 2011-05-08 NOTE — Group Therapy Note (Signed)
NAMEMarland Kitchen  Susan Kaufman, Susan Kaufman NO.:  [id number]   MEDICAL RECORD NO.:  [id number]          PATIENT TYPE:  WOC   LOCATION:  [facility]                   FACILITY:  [facility]   PHYSICIAN:  Scheryl Darter, MD       DATE OF BIRTH:  11/01/1956   DATE OF SERVICE:  08/17/2009                                  CLINIC NOTE   The patient returns today for a wound check.  She is 16 days  postoperative from a total abdominal hysterectomy, bilateral salpingo-  oophorectomy.  She had some superficial wound separation and some  bleeding consistent with probable hematoma.  She is getting daily wound  care by home health.  She says there are some blood on dressing which  has improved somewhat when she stopped taking her daily aspirin a couple  days ago.  No fevers or redness or increased pain.  Dressing was removed  and pack was removed and the wound appears to be about 2 cm x 1.5 cm x 2  cm deep with granulation tissue and only small amount of blood.  The  wound was packed with gauze and covered with ABD pads.  She will  continue with home health visits.  Recommend she return to 2 weeks for  postoperative visit with Dr. Penne Lash.      Scheryl Darter, MD     JA/MEDQ  D:  08/17/2009  T:  08/18/2009  Job:  [id number]

## 2011-05-08 NOTE — Group Therapy Note (Signed)
Susan Kaufman, Susan Kaufman NO.:  [id number]   MEDICAL RECORD NO.:  [id number]          PATIENT TYPE:  WOC   LOCATION:  [facility]                   [facility]   PHYSICIAN:  Elsie Lincoln, MD      DATE OF BIRTH:  1956-04-29   DATE OF SERVICE:                                  CLINIC NOTE   HISTORY:  The patient is a 55 year old female with a markedly enlarged  uterus.  She presents today for a Pap smear.  She still is bleeding on  the Provera; however, she is not hemorrhaging.  We were able to do the  Pap smear.  Hopefully it will not be obscured by blood.  We talked about  waiting for Depot Lupron.  We have applied for a free injection from the  company, waiting to hear.  The patient will call Ms. Lydia Guiles next  Tuesday.  It will be 03/29/2009, as she has not heard from them.  If she  does not get improved for the injections, her surgery is scheduled for  04/06/2009.  We will do a TAH/BSO.  The patient consented for the  procedure.  The risks of the procedure include, but are not limited to  bleeding, infection or damage to the intra-abdominal organs.  The  patient consents for both ovaries to be removed as well.  Hopefully we  will be able to get the injections, so we can shrink her uterus, to make  it technically an easier operation with less morbidity to the patient.           ______________________________  Elsie Lincoln, MD     KL/MEDQ  D:  03/24/2009  T:  03/24/2009  Job:  [id number]

## 2011-05-08 NOTE — Op Note (Signed)
Susan Kaufman, Susan NO.:  [id number]   MEDICAL RECORD NO.:  [id number]          PATIENT TYPE:  INP   LOCATION:  9302                          [facility]   PHYSICIAN:  Lesly Dukes, M.D. DATE OF BIRTH:  1956-10-27   DATE OF PROCEDURE:  DATE OF DISCHARGE:                               OPERATIVE REPORT   PREOPERATIVE DIAGNOSES:  A 55 year old female with a large fibroid  uterus, abnormal uterine bleeding, and pelvic pain.   POSTOPERATIVE DIAGNOSES:  A 55 year old female with a large fibroid  uterus, abnormal uterine bleeding, and pelvic pain, and endometriosis.   PROCEDURE:  Total abdominal hysterectomy, bilateral salpingo-  oophorectomy.   SURGEON:  Lesly Dukes, MD   ASSISTANT:  Javier Glazier. Rose, MD.   ANESTHESIA:  General.   ESTIMATED BLOOD LOSS:  300.   COMPLICATIONS:  None.   FINDINGS:  Enlarged uterus about the level of the umbilicus.  Right  ovary contains an endometrioma that is adhesed to the back side of the  uterus near the cervix.  The right fallopian tubes appeared normal  grossly.  The left ovary appears normal grossly.  The left fallopian  tube appears normal grossly.  The sigmoid colon is adhesed to the back  side of the uterus and the posterior cul-de-sac was obliterated.   PROCEDURE:  After informed consent was obtained, the patient was taken  to the operating room where general anesthesia was induced.  Ancef was  already given prior to the skin incision.  SCDs were on the patient's  lower extremities.  The patient was placed in dorsal lithotomy position  and was prepared and draped in normal sterile fashion.  A Foley was in  the bladder.  A vertical skin incision was made with scalpel and carried  down to the fascia.  The fascia was incised in the midline, extended  both superiorly and inferiorly.  The rectus muscles were separated in  the midline.  The peritoneum was entered sharp and bluntly.  The  peritoneal incision was  extended both superior and inferiorly with good  visualization of the bladder.  An Alexis retractor was placed into the  abdomen, and the uterus was found on the surgical field.  Clamps were  placed across the cornua.  Each round ligament was identified, clamped,  transected, and suture ligated with 0 Vicryl.  The bladder flap was then  created.  The ovarian ligaments were isolated, clamped, transected with  0 Vicryl.  Once the uterus was out, we completed the oophrectomy when we  could have good visualization of the ureters.  The uterine arteries were  then skeletonized.  Some of the sigmoid colon was dissected in order to  get access to the uterine arteries posteriorly.  Once the uterine  arteries were visualized, they were clamped, transected, and suture  ligated with 0 Vicryl.  The cardinal ligaments were clamped, transected,  and suture ligated with 0 Vicryl.  At this point, we decided to proceed  with a supracervical hysterectomy since the posterior cul de sac was  completely obliterated.  Further disection was very difficult and  causing large amount of bleeding.  The patient has never had any  abnormal Pap smears and is at low risk for cervical cancer given that  she was is no longer sexually active.  At this point, the ovaries were  identified and the ureters were identified bilaterally.  The  infundibulopelvic ligaments were clamped, transected, and suture ligated  with 0 Vicryl x2 with good hemostasis.  The cervix was oversewn and  hemostasis was achieved at the stump of the cervix.  There was a little  bit of bleeding near the bladder flap and hemostasis was achieved with  Gelfoam.  The pelvis was copiously irrigated and again all pedicles and  the cervical stump was noted to be hemostatic.  The peritoneum and  rectus muscles were noted to be hemostatic.  The fascia was closed with  looped 0 PDS in a running fashion with good hemostasis.  The  subcutaneous tissue was copiously  irrigated and the subcutaneous tissue  was closed with 0 plain.  Skin was closed with staples.  The patient  tolerated the procedure well.  Sponge, lap, instrument, needle count  correct x2 and the patient recovered in stable condition.      Lesly Dukes, M.D.  Electronically Signed     KHL/MEDQ  D:  08/01/2009  T:  08/02/2009  Job:  [id number]

## 2011-05-08 NOTE — Group Therapy Note (Signed)
NAMEMarland Kitchen  Susan Kaufman, Susan Kaufman NO.:  [id number]   MEDICAL RECORD NO.:  [id number]          PATIENT TYPE:  WOC   LOCATION:  [facility]                   [facility]   PHYSICIAN:  Elsie Lincoln, MD      DATE OF BIRTH:  Dec 11, 1956   DATE OF SERVICE:                                  CLINIC NOTE   HISTORY:  The patient is a 55 year old G3, P3 female who presents from  an MAU followup.  The patient although is [place] is visiting the  [place] and has noted she has been having fibroids for several  months.  She was first seen in [place] in January, but decided to move  here to be near support network and finish her care here.  She presented  to the MAU on February 28, 2009 because she felt dizzy and lightheaded.  Ultrasound was done which showed an 18 cm enlarged uterus with multiple  exophytic fibroids.  The endometrial stripe was 1.7 cm.  The right and  left ovary were not seen.  Of note though the patient was complaining of  heavy vaginal bleeding, her hemoglobin was 13.4. The patient wants  hysterectomy and I do believe this is prudent.  However, the patient  needs endometrial biopsy, Pap smear and her medical problems contained.   PAST MEDICAL HISTORY:  1. High blood pressure requiring 2 medications.  2. GERD.  3. Hypercholesterolemia.  4. Lower extremity swelling.  5. Chronic back and neck pain from a debilitating injury.  6. Anemia.   PAST SURGICAL HISTORY:  Denies all surgeries.   GYN HISTORY:  Never had a Pap smear.  Does know she has fibroids, but  since January.  No STDs, but  has not had very good GYN care.   FAMILY HISTORY:  Mother and grandfather have diabetes.  Both parents  have heart disease.  Her father has had a heart attack and both parents  have hypertension.   SOCIAL HISTORY:  No drinking, drugs or alcohol.   MEDICATIONS:  1. Atenolol.  2. Prevacid.  3. Lisinopril.  4. Atorvastatin.  5. Viramide.  6. Meloxicam.  7. Multivitamins.  8. Iron.  9. Aspirin.   ALLERGIES:  ASPIRIN with injection only.   REVIEW OF SYSTEMS:  Positive for bruising, numbness and weakness in  finger, swelling in legs, muscle aches, fatigue, weight loss, frequent  headaches, dizzy spells, problems with breathing, shortness of breath,  chest pain, nausea, vomiting, problems with urination, loss of urine  with coughing, sneezing, vaginal bleeding.   PHYSICAL EXAMINATION:  VITAL SIGNS:  Temperature 98.3, pulse 72, blood  pressure 147/81, weight 229, height 5 feet 4 inches.  GENERAL:  Well-nourished, well-developed in no apparent distress.  Family and friend interpreter were used for all this exam and history.  CHEST:  Normal movement.  ABDOMEN:  Obese.  Uterus palpated up to just below the umbilicus.  No  organomegaly, but difficult secondary to large uterus and obesity.  No  rebound, no guarding.  GENITALIA:  Tanner 5.  Vagina:  Blood in the vault, but no clots.  Urethra and bladder nontender.  Cervix:  Large  uterus 16 cm sounded,  enlarged, but no rebound.  See separate procedure note for endometrial  biopsy.  EXTREMITIES:  Nontender.   ASSESSMENT:  A 55 year old female with markedly enlarged uterus,  dysfunctional uterine bleeding and thickened endometrial stripe.   PLAN:  1. The patient feels lightheaded.  We will do orthostatics and if she      is orthostatic, we will send her to the emergency room.  2. The patient had chest pain last night, offered the emergency room      to her for that and she declined.  The patient will return to [facility] or [facility] if her chest pain occurs.  3. The patient needs a primary care physician to address all these      medical problems.  4. Endometrial biopsy done today.  5. TSH, CBC, BMP today.  6. We will stop her period with Provera so we can do a Pap smear as      she has never had one.  We will give her 10 mg twice a day.  7. The patient was given refills on atenolol, Prevacid,  lisinopril and      atorvastatin until she can find a primary care physician.  I did      not give her a refill on her diuretic.  8. Mammogram.  9. Return to clinic in 3 weeks.   PROCEDURE NOTE:  After informed consent was obtained with the language  line, patient was placed in the  dorsal lithotomy position and prepared in a normal sterile fashion.  The  cervix was brought into view and cleaned with Betadine.  The cervix was  grasped with a single tooth tenaculum and 2 passes were made with the  Pipelle.  Uterus sounded to 16 cm.  The patient tolerated the procedure  well.           ______________________________  Elsie Lincoln, MD     KL/MEDQ  D:  03/09/2009  T:  03/10/2009  Job:  [id number]

## 2011-05-08 NOTE — Discharge Summary (Signed)
Susan Kaufman, Susan Kaufman NO.:  [id number]   MEDICAL RECORD NO.:  [id number]         PATIENT TYPE:  WINP   LOCATION:                                [facility]   PHYSICIAN:  Lesly Dukes, M.D. DATE OF BIRTH:  [birth year]   DATE OF ADMISSION:  08/01/2009  DATE OF DISCHARGE:                               DISCHARGE SUMMARY   HOSPITAL COURSE:  The patient is a 55 year old female who had a  supracervical hysterectomy, bilateral salpingo-oophorectomy for fibroids  and endometriosis.  Details of this surgery can be seen in the dictated  operative note.  The patient did well post operatively.  She ambulated,  passed flatus, tolerated solids, and urinated.  The patient did not  develop fever postoperatively.   PAST MEDICAL HISTORY:  1. GERD.  2. Chronic hypertension.  3. Hypercholesterolemia.  4. Chest pain with a negative cardiac cath.  5. Obesity.   LABORATORY DATA:  Postoperative hemoglobin 11.6, postoperative  hematocrit 34.3, postoperative platelet count 356.   DISCHARGE MEDICATIONS:  1. Percocet 5/325, 1-2 tabs p.o. q.4-6 hours p.r.n.  2. Pravastatin 20 mg p.o. daily.  3. Lisinopril 20 mg p.o. daily.  4. Aspirin 81 mg p.o. daily.  5. Nexium 40 mg p.o. daily.  6. Lasix 20 mg p.o. p.r.n.  7. Atenolol 100 mg p.o. daily.  8. Multivitamin p.o. daily.   DISCHARGE INSTRUCTIONS:  1. No heavy lifting for 6 weeks.  2. No driving for 2 weeks.  3. Nothing per vagina for 6 weeks.  4. Keep incisions clean and dry.  5. Increase activity slowly.  6. Return to the hospital or clinic with temperature greater than or      equal 100.4, intractable nausea or vomiting, or      severe abdominal pain.  7. Follow up appointment is August 08, 2009, at 9 a.m.  The patient      verbalizes understanding of all instructions.  I have used son as      interpreter.  The patient refused the hospital interpreter as she      has with all prior encounters.      Lesly Dukes,  M.D.  Electronically Signed     KHL/MEDQ  D:  08/03/2009  T:  08/03/2009  Job:  [id number]

## 2011-05-08 NOTE — Group Therapy Note (Signed)
NAMEMarland Kitchen  SHERMAN, LIPUMA NO.:  [id number]   MEDICAL RECORD NO.:  [id number]          PATIENT TYPE:  WOC   LOCATION:  [facility]                   FACILITY:  [facility]   PHYSICIAN:  Elsie Lincoln, MD      DATE OF BIRTH:  1956-07-09   DATE OF SERVICE:  07/01/2009                                  CLINIC NOTE   The patient is a 55 year old female who is here for followup of  dysfunctional uterine bleeding and fibroids.  Fibroids have shrunken  from above the umbilicus to just below the umbilicus.  She has been on  Lupron and is not bleeding.  Interestingly, after her first dose of Depo-  Lupron, she had extreme spike in blood pressure that required multiple  adjustments in her blood pressure medication.  Although this is not a  common side effect of Lupron, I am wary about giving her another shot.  She has difficult access to care and I think her uterus is also small  enough that we could proceed with hysterectomy safely.  We talked about  the problem that the patient is possibly going in the menopause and  avoiding hysterectomy.  We also talked about the risks of the  hysterectomy being bleeding, infection and damage to the intra-abdominal  organs.  Also talked about taking the ovaries out.  The patient has  opted for hysterectomy and bilateral salpingo-oophorectomy.  Of note,  her son is a Nurse, learning disability.  She has forgone IT trainer.  Her son  is a Equities trader and is getting his graduate degree in [employer].  The  patient needs the surgery sooner than later given that the Lupron will  be wearing off, uterus will start to grow again.  They were scheduled  for July 30 at noon.  She needs cardiac clearance.  I will also have my  nurse call [facility] and make sure she gets cardiac clearance.  The  patient is also complaining of a rash.  I did give her a prescription  for triamcinolone cream to use twice a day for 2 weeks.  The patient  will get a letter in the mail for  her preop appointment and her official  surgery with date and time.           ______________________________  Elsie Lincoln, MD     KL/MEDQ  D:  07/01/2009  T:  07/02/2009  Job:  [id number]   cc:   Dala Dock Doctor

## 2011-07-17 ENCOUNTER — Encounter: Payer: Self-pay | Admitting: Cardiovascular Disease

## 2011-08-13 ENCOUNTER — Other Ambulatory Visit: Payer: Self-pay | Admitting: Neurosurgery

## 2011-08-13 DIAGNOSIS — M47812 Spondylosis without myelopathy or radiculopathy, cervical region: Secondary | ICD-10-CM

## 2011-08-13 DIAGNOSIS — M47816 Spondylosis without myelopathy or radiculopathy, lumbar region: Secondary | ICD-10-CM

## 2011-08-24 ENCOUNTER — Ambulatory Visit
Admission: RE | Admit: 2011-08-24 | Discharge: 2011-08-24 | Disposition: A | Discharge status: Home / Self Care | Payer: Self-pay | Source: Ambulatory Visit | Attending: Neurosurgery | Admitting: Neurosurgery

## 2011-08-24 DIAGNOSIS — M47816 Spondylosis without myelopathy or radiculopathy, lumbar region: Secondary | ICD-10-CM

## 2011-08-24 DIAGNOSIS — M47812 Spondylosis without myelopathy or radiculopathy, cervical region: Secondary | ICD-10-CM

## 2011-11-27 ENCOUNTER — Encounter: Payer: Self-pay | Attending: Neurosurgery | Admitting: Neurosurgery

## 2014-03-29 ENCOUNTER — Encounter: Payer: Self-pay | Admitting: Physical Medicine & Rehabilitation

## 2014-09-13 ENCOUNTER — Encounter: Payer: Self-pay | Admitting: Internal Medicine

## 2015-02-22 ENCOUNTER — Ambulatory Visit: Payer: Self-pay | Admitting: Physical Medicine & Rehabilitation

## 2015-02-22 ENCOUNTER — Encounter: Payer: Self-pay | Attending: Physical Medicine & Rehabilitation

## 2019-04-14 ENCOUNTER — Encounter: Payer: Self-pay | Admitting: Sports Medicine

## 2019-04-14 ENCOUNTER — Telehealth: Payer: Self-pay | Admitting: Sports Medicine

## 2019-04-14 ENCOUNTER — Ambulatory Visit (INDEPENDENT_AMBULATORY_CARE_PROVIDER_SITE_OTHER): Payer: PRIVATE HEALTH INSURANCE | Admitting: Sports Medicine

## 2019-04-14 ENCOUNTER — Ambulatory Visit (INDEPENDENT_AMBULATORY_CARE_PROVIDER_SITE_OTHER): Payer: PRIVATE HEALTH INSURANCE

## 2019-04-14 ENCOUNTER — Other Ambulatory Visit: Payer: Self-pay

## 2019-04-14 ENCOUNTER — Telehealth: Payer: Self-pay | Admitting: General Practice

## 2019-04-14 DIAGNOSIS — M1732 Unilateral post-traumatic osteoarthritis, left knee: Secondary | ICD-10-CM | POA: Diagnosis not present

## 2019-04-14 DIAGNOSIS — M48061 Spinal stenosis, lumbar region without neurogenic claudication: Secondary | ICD-10-CM | POA: Insufficient documentation

## 2019-04-14 MED ORDER — PREDNISONE 50 MG PO TABS: ORAL_TABLET | ORAL | 0 refills | Status: DC

## 2019-04-14 MED ORDER — CELECOXIB 200 MG PO CAPS: ORAL_CAPSULE | ORAL | 2 refills | Status: AC

## 2019-04-14 MED ORDER — TRAMADOL HCL 50 MG PO TABS: 50.0000 mg | ORAL_TABLET | Freq: Three times a day (TID) | ORAL | 0 refills | Status: DC | PRN

## 2019-04-14 MED FILL — predniSONE 50 MG TABS: 50 | 5 days supply | Qty: 5 | Fill #0

## 2019-04-14 MED FILL — traMADol HCL 50 MG TABS: 50 | 7 days supply | Qty: 21 | Fill #0
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
## 2019-04-14 NOTE — Telephone Encounter (Signed)
Received fax from [employer] that Celebrex requires a PA. Information has been sent to the insurance company. Awaiting determination.
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
## 2019-04-14 NOTE — Telephone Encounter (Signed)
Medication: Celecoxib 200 MG Capsules  Key: [id number]  [employer]

## 2019-04-14 NOTE — Assessment & Plan Note (Signed)
Worst at L4-L5 with left-sided posterior thigh radicular symptoms. Currently on gabapentin 300 twice a day that is causing some drowsiness. Switching from Medrol to prednisone for 5 days, followed by Celebrex twice a day as needed for 2 weeks. She does get dyspepsia with NSAIDs but Celebrex should be a bit easier for her. Adding some tramadol for breakthrough pain. She will do formal PT starting tomorrow. Return to see me in 2 to 3 weeks. She has had epidurals in the past that were very effective.  I would need an MRI if we choose to do another epidural.

## 2019-04-15 MED FILL — CELECOXIB 200 MG CAP: 200 | 30 days supply | Qty: 60 | Fill #0

## 2019-04-15 NOTE — Telephone Encounter (Signed)
See other note for approval.

## 2019-04-15 NOTE — Telephone Encounter (Signed)
Approved today Approved for Celecoxib Capsule 200 MG, quantity 60 per 30 days. Pharmacy aware and form sent to scan.

## 2019-04-16 ENCOUNTER — Ambulatory Visit (INDEPENDENT_AMBULATORY_CARE_PROVIDER_SITE_OTHER): Payer: PRIVATE HEALTH INSURANCE | Admitting: Rehabilitative and Restorative Service Providers"

## 2019-04-16 ENCOUNTER — Other Ambulatory Visit: Payer: Self-pay

## 2019-04-16 ENCOUNTER — Encounter: Payer: Self-pay | Admitting: Rehabilitative and Restorative Service Providers"

## 2019-04-16 DIAGNOSIS — M25562 Pain in left knee: Secondary | ICD-10-CM | POA: Diagnosis not present

## 2019-04-16 DIAGNOSIS — R29898 Other symptoms and signs involving the musculoskeletal system: Secondary | ICD-10-CM

## 2019-04-16 DIAGNOSIS — M6281 Muscle weakness (generalized): Secondary | ICD-10-CM | POA: Diagnosis not present

## 2019-04-16 DIAGNOSIS — R2689 Other abnormalities of gait and mobility: Secondary | ICD-10-CM | POA: Diagnosis not present
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
## 2019-04-16 NOTE — Patient Instructions (Signed)
Access Code: [email or web address]  URL: [email or web address]  Date: 04/16/2019  Prepared by: Gillermo Murdoch   Exercises  Supine Quad Set - 10 reps - 1 sets - 10 sec hold - 3x daily - 7x weekly  Supine Hamstring Stretch with Strap - 3 reps - 1 sets - 30 sec hold - 3x daily - 7x weekly  Seated Hamstring Stretch - 3 reps - 1 sets - 30 sec hold - 3x daily - 7x weekly  Seated Gastroc Stretch with Strap - 3 reps - 1 sets - 30 sec hold - 2x daily - 7x weekly   Standing tall  Self massage

## 2019-04-20 ENCOUNTER — Encounter: Payer: PRIVATE HEALTH INSURANCE | Admitting: Physical Therapy

## 2019-04-22 ENCOUNTER — Encounter: Payer: Self-pay | Admitting: Sports Medicine

## 2019-04-22 ENCOUNTER — Ambulatory Visit (INDEPENDENT_AMBULATORY_CARE_PROVIDER_SITE_OTHER): Payer: PRIVATE HEALTH INSURANCE | Admitting: Sports Medicine

## 2019-04-22 ENCOUNTER — Ambulatory Visit (INDEPENDENT_AMBULATORY_CARE_PROVIDER_SITE_OTHER): Payer: PRIVATE HEALTH INSURANCE | Admitting: Physical Therapy

## 2019-04-22 ENCOUNTER — Other Ambulatory Visit: Payer: Self-pay

## 2019-04-22 DIAGNOSIS — M4807 Spinal stenosis, lumbosacral region: Secondary | ICD-10-CM | POA: Diagnosis not present

## 2019-04-22 DIAGNOSIS — M48061 Spinal stenosis, lumbar region without neurogenic claudication: Secondary | ICD-10-CM

## 2019-04-22 DIAGNOSIS — M6281 Muscle weakness (generalized): Secondary | ICD-10-CM

## 2019-04-22 DIAGNOSIS — R2689 Other abnormalities of gait and mobility: Secondary | ICD-10-CM

## 2019-04-22 DIAGNOSIS — R29898 Other symptoms and signs involving the musculoskeletal system: Secondary | ICD-10-CM | POA: Diagnosis not present

## 2019-04-22 DIAGNOSIS — M25562 Pain in left knee: Secondary | ICD-10-CM

## 2019-04-22 DIAGNOSIS — M1732 Unilateral post-traumatic osteoarthritis, left knee: Secondary | ICD-10-CM

## 2019-04-22 MED ORDER — TRAMADOL HCL 50 MG PO TABS: 50.0000 mg | ORAL_TABLET | Freq: Two times a day (BID) | ORAL | 0 refills | Status: DC | PRN

## 2019-04-22 MED ORDER — OMEPRAZOLE 40 MG PO CPDR: 40.0000 mg | DELAYED_RELEASE_CAPSULE | Freq: Two times a day (BID) | ORAL | 11 refills | Status: AC

## 2019-04-22 MED FILL — OMEPRAZOLE 40 MG CPDR: 40 | 30 days supply | Qty: 60 | Fill #0

## 2019-04-22 MED FILL — traMADol HCL 50 MG TABS: 50 | 7 days supply | Qty: 14 | Fill #0

## 2019-04-22 NOTE — Progress Notes (Addendum)
Subjective:    CC: Follow-up  HPI: This is a pleasant 63 year old female, she has posttraumatic osteoarthritis of her left knee, we did an injection at the last visit and she is about 40% better, pain is still anterior, no mechanical symptoms, localized without radiation.  Lumbar spinal stenosis: Moderate, persistent, radiating down the left leg, she is taking gabapentin at 300 twice daily with only a bit of drowsiness, only using Celebrex once a day right now.  She is currently going through physical therapy.  She has done greater than 6 weeks of physician directed rehabilitation in the recent past, and she does desire to proceed to interventional treatment.  I reviewed the past medical history, family history, social history, surgical history, and allergies today and no changes were needed.  Please see the problem list section below in epic for further details.  Past Medical History: Past Medical History:  Diagnosis Date  . Anemia, unspecified   . Backache, unspecified   . Cervical spine disease   . Chest pain, unspecified   . Closed fracture of rib(s), unspecified   . Contact dermatitis and other eczema, due to unspecified cause   . Degeneration of cervical intervertebral disc   . Esophageal reflux   . Essential hypertension, benign   . Leiomyoma of uterus, unspecified   . Other and unspecified hyperlipidemia   . Other nonspecific abnormal cardiovascular system function study   . Pain in joint, lower leg   . Pre-operative cardiovascular examination   . Pure hypercholesterolemia   . Unspecified part of closed fracture of clavicle    Past Surgical History: Past Surgical History:  Procedure Laterality Date  . BILATERAL SALPINGOOPHORECTOMY    . CARPAL TUNNEL RELEASE  02/2002   right  . COLONOSCOPY  09/06/2010  . TOTAL ABDOMINAL HYSTERECTOMY     Social History: Social History   Socioeconomic History  . Marital status: Widowed    Spouse name: Not on file  . Number of  children: 3  . Years of education: Not on file  . Highest education level: Not on file  Occupational History  . Not on file  Social Needs  . Financial resource strain: Not on file  . Food insecurity:    Worry: Not on file    Inability: Not on file  . Transportation needs:    Medical: Not on file    Non-medical: Not on file  Tobacco Use  . Smoking status: Never Smoker  . Smokeless tobacco: Never Used  Substance and Sexual Activity  . Alcohol use: No  . Drug use: No  . Sexual activity: Not on file  Lifestyle  . Physical activity:    Days per week: Not on file    Minutes per session: Not on file  . Stress: Not on file  Relationships  . Social connections:    Talks on phone: Not on file    Gets together: Not on file    Attends religious service: Not on file    Active member of club or organization: Not on file    Attends meetings of clubs or organizations: Not on file    Relationship status: Not on file  Other Topics Concern  . Not on file  Social History Narrative   [place] Citizen. Recent trip to [place] and returned in 11/2008. Lost husband last year. Lives in [place]   Family History: Family History  Problem Relation Age of Onset  . Coronary artery disease Father   . Heart attack Father   .  Hypertension Mother   . Coronary artery disease Brother    Allergies: Allergies  Allergen Reactions  . Aspirin     REACTION: in the injection form   Medications: See med rec.  Review of Systems: No fevers, chills, night sweats, weight loss, chest pain, or shortness of breath.   Objective:    General: Well Developed, well nourished, and in no acute distress.  Neuro: Alert and oriented x3, extra-ocular muscles intact, sensation grossly intact.  HEENT: Normocephalic, atraumatic, pupils equal round reactive to light, neck supple, no masses, no lymphadenopathy, thyroid nonpalpable.  Skin: Warm and dry, no rashes. Cardiac: Regular rate and rhythm, no murmurs rubs or gallops,  no lower extremity edema.  Respiratory: Clear to auscultation bilaterally. Not using accessory muscles, speaking in full sentences.  Impression and Recommendations:    Post-traumatic osteoarthritis of left knee 40% improvement after injection. Continue knee brace, physical therapy. She is only doing Celebrex once a day, increasing to twice a day and she will add on omeprazole with the Celebrex dosing, she has not had any symptoms of gastritis. Refilling tramadol as well.  Lumbar spinal stenosis L4-L5 spinal stenosis, left-sided posterior thigh radicular symptoms. Continue gabapentin 300 twice daily, she is getting a slight drowsiness. Increase Celebrex to twice daily, tramadol will be continued twice daily. Continue formal physical therapy. At this point we are going to proceed with an MRI in anticipation of a left L4-L5 interlaminar epidural with Dr. Francesco Runner. Overall she has failed greater than 6 weeks of physician directed conservative measures.  Moderate spinal stenosis at L4-L5 as expected, proceeding with left L4-L5 interlaminar epidural.  I spent 25 minutes with this patient, greater than 50% was face-to-face time counseling regarding the above diagnoses.  ___________________________________________ Gwen Her. Dianah Field, M.D., ABFM., CAQSM. Primary Care and Sports Medicine [facility]  Adjunct Professor of [employer]

## 2019-04-22 NOTE — Therapy (Addendum)
[place] [address] [place] [address] [place], [place], [address] Phone: [phone]   Fax:  [phone]  Physical Therapy Treatment  Patient Details  Name: Netty Sullivant MRN: [id number] Date of Birth: 28-Sep-1956 Referring Provider (PT): Dr Dianah Field    Encounter Date: 04/22/2019  PT End of Session - 04/22/19 1026    Visit Number  2    Number of Visits  12    Date for PT Re-Evaluation  05/28/19    PT Start Time  0804    PT Stop Time  5176    PT Time Calculation (min)  53 min    Activity Tolerance  Patient tolerated treatment well    Behavior During Therapy  [facility] for tasks assessed/performed       Past Medical History:  Diagnosis Date  . Anemia, unspecified   . Backache, unspecified   . Cervical spine disease   . Chest pain, unspecified   . Closed fracture of rib(s), unspecified   . Contact dermatitis and other eczema, due to unspecified cause   . Degeneration of cervical intervertebral disc   . Esophageal reflux   . Essential hypertension, benign   . Leiomyoma of uterus, unspecified   . Other and unspecified hyperlipidemia   . Other nonspecific abnormal cardiovascular system function study   . Pain in joint, lower leg   . Pre-operative cardiovascular examination   . Pure hypercholesterolemia   . Unspecified part of closed fracture of clavicle     Past Surgical History:  Procedure Laterality Date  . BILATERAL SALPINGOOPHORECTOMY    . CARPAL TUNNEL RELEASE  02/2002   right  . COLONOSCOPY  09/06/2010  . TOTAL ABDOMINAL HYSTERECTOMY      There were no vitals filed for this visit.  Subjective Assessment - 04/22/19 0817    Subjective  Per pt's son, pt has been doing exercises 1x/day.  She continues to have limited tolerance for walking; can only walk 5 min prior to pain increasing.  She thinks she may have overdone it with exercises yesterday.     Patient Stated Goals  do household chores and walk around freely; ADL's      Currently in Pain?  Yes    Pain Score  6     Pain Location  Knee    Pain Orientation  Left    Pain Descriptors / Indicators  Tender;Sore    Pain Radiating Towards  into posterior thigh/calf    Aggravating Factors   walking     Pain Relieving Factors  sitting and resting. medication         [facility] PT Assessment - 04/22/19 0001      Assessment   Medical Diagnosis  OA Lt knee; LBP     Referring Provider (PT)  Dr Dianah Field     Onset Date/Surgical Date  03/29/19    Hand Dominance  Right    Next MD Visit  to schedule in 2 weeks     Prior Therapy  yes for LBP        [facility] Adult PT Treatment/Exercise - 04/22/19 0001      Self-Care   Self-Care  --      Therapeutic Activites    Therapeutic Activities  Other Therapeutic Activities    Other Therapeutic Activities  Pt instructed in safe sit to/from stand with cues for safe hand placement on chair and rollator; pt return demo with cues x 4 reps.  Pt instructed in sit to/from stand via log roll.  Pt returned demo with cues.       Knee/Hip Exercises: Stretches   Passive Hamstring Stretch  Left;Right;3 reps;30 seconds   seated with straight back    Quad Stretch  Left;3 reps;Right;2 reps;30 seconds   seated with foot under chair   Quad Stretch Limitations  limited tolerance on Lt side.     Gastroc Stretch  Left;2 reps;30 seconds   standing runners stretch     Knee/Hip Exercises: Aerobic   Nustep  L3: 10 min, for ROM then warm up; PTA present to monitor and discuss progress       Knee/Hip Exercises: Standing   Functional Squat  2 sets;5 reps   mini; with rollator and heavy cues.    Other Standing Knee Exercises  standing with hands on rollator, cues for upright posture and equal WB into LEs       Modalities   Modalities  --   held, due to MD appt immediately following therapy     Manual Therapy   Manual Therapy  Taping;Soft tissue mobilization    Manual therapy comments  Reg Rock tape applied with 20% stretch from med L knee  at pes anserine to medial quad, perpendicular strip over pes ansering with 50% stretch to decompress tissue and decrease pain.  Another I strip place from Lt lateral quad to Lt VMO with 20% stretch    Soft tissue mobilization  IASTM and STM to Lt distal medial quad to decrease fascial restriction and improve knee ROM                  PT Long Term Goals - 04/16/19 1322      PT LONG TERM GOAL #1   Title  Decrease Lt LE pain by 50-75% allowing patient to resume normal functional activities 05/28/2019    Time  6    Period  Weeks    Status  New      PT LONG TERM GOAL #2   Title  Increase AROM Lt knee to equal Rt knee 05/28/2019    Time  6    Period  Weeks      PT LONG TERM GOAL #3   Title  Increase functional strength Lt knee allowing patient to transfer independently and ambulate community distances of 400-500 ft with minimal to no difficulty 05/28/2019    Time  6    Period  Weeks    Status  New    Target Date  05/28/19      PT LONG TERM GOAL #4   Title  Independent in HEP 05/28/2019    Time  6    Period  Weeks    Status  New      PT LONG TERM GOAL #5   Title  Improve FOTO to </= 48% limitation 05/28/2019    Time  6    Period  Weeks    Status  New            Plan - 04/22/19 1027    Clinical Impression Statement  Pt continues with persistant Lt knee and hamstring/calf.  Pt reported reduction of knee pain when using NuStep and further reduction during STM. She required minor cues for form with exercise and safety with transfers with rollator. Pt is progressing towards goals.     Rehab Potential  Good    PT Frequency  2x / week    PT Duration  6 weeks    PT Treatment/Interventions  Patient/family education;ADLs/Self Care Home Management;Cryotherapy;Dealer  Stimulation;Iontophoresis '4mg'$ /ml Dexamethasone;Moist Heat;Ultrasound;Therapeutic activities;Therapeutic exercise;Balance training;Neuromuscular re-education;Manual techniques;Dry needling;Vasopneumatic Device    PT  Next Visit Plan  manual therapy to Lt thigh/calf; continue stretching LE and functional strengthening     PT Home Exercise Plan  Access Code: [email or web address]     Consulted and Agree with Plan of Care  Patient       Patient will benefit from skilled therapeutic intervention in order to improve the following deficits and impairments:  Postural dysfunction, Improper body mechanics, Pain, Increased fascial restricitons, Increased muscle spasms, Decreased strength, Decreased mobility, Decreased range of motion, Abnormal gait, Decreased activity tolerance  Visit Diagnosis: Acute pain of left knee  Muscle weakness (generalized)  Other symptoms and signs involving the musculoskeletal system  Other abnormalities of gait and mobility     Problem List Patient Active Problem List   Diagnosis Date Noted  . Lumbar spinal stenosis 04/14/2019  . DENTAL CARIES 01/26/2011  . PAIN IN JOINT, MULTIPLE SITES 07/20/2010  . HOT FLASHES 11/24/2009  . ANXIETY 10/27/2009  . NECK PAIN 09/26/2009  . HYPERLIPIDEMIA 08/02/2009  . ANEMIA 08/02/2009  . GERD 08/02/2009  . ACQUIRED ABSENCE OF BOTH CERVIX AND UTERUS 08/01/2009  . CHEST PAIN 07/19/2009  . ABNORMAL CV (STRESS) TEST 07/19/2009  . CONTACT DERMATITIS 07/06/2009  . HYPERCHOLESTEROLEMIA 05/25/2009  . HYPERTENSION, BENIGN ESSENTIAL 05/25/2009  . DEGENERATIVE DISC DISEASE, CERVICAL SPINE 05/25/2009  . BACK PAIN 05/25/2009  . Post-traumatic osteoarthritis of left knee 12/24/2002  . CLOSED FRACTURE OF RIB, UNSPECIFIED 12/24/2002  . FRACTURE, CLAVICLE 12/24/2002   Kerin Perna, PTA 04/22/19 10:53 AM  [facility] [address] [place], [place], [address] Phone: [phone]   Fax:  [phone]  Name: Congetta Odriscoll MRN: [id number] Date of Birth: 1956-05-18  PHYSICAL THERAPY DISCHARGE SUMMARY  Visits from Start of Care: 2  Current functional level related to goals / functional  outcomes: See last progress note for discharge status    Remaining deficits: Unknown    Education / Equipment: HEP  Plan: Patient agrees to discharge.  Patient goals were not met. Patient is being discharged due to not returning since the last visit.  ?????     Celyn P. Helene Kelp PT, MPH 05/27/19 4:34 PM

## 2019-04-22 NOTE — Assessment & Plan Note (Addendum)
L4-L5 spinal stenosis, left-sided posterior thigh radicular symptoms. Continue gabapentin 300 twice daily, she is getting a slight drowsiness. Increase Celebrex to twice daily, tramadol will be continued twice daily. Continue formal physical therapy. At this point we are going to proceed with an MRI in anticipation of a left L4-L5 interlaminar epidural with Dr. Francesco Runner. Overall she has failed greater than 6 weeks of physician directed conservative measures.  Moderate spinal stenosis at L4-L5 as expected, proceeding with left L4-L5 interlaminar epidural.

## 2019-04-22 NOTE — Assessment & Plan Note (Signed)
40% improvement after injection. Continue knee brace, physical therapy. She is only doing Celebrex once a day, increasing to twice a day and she will add on omeprazole with the Celebrex dosing, she has not had any symptoms of gastritis. Refilling tramadol as well.

## 2019-04-26 ENCOUNTER — Other Ambulatory Visit: Payer: Self-pay

## 2019-04-26 ENCOUNTER — Ambulatory Visit (INDEPENDENT_AMBULATORY_CARE_PROVIDER_SITE_OTHER): Payer: PRIVATE HEALTH INSURANCE

## 2019-04-26 DIAGNOSIS — M4807 Spinal stenosis, lumbosacral region: Secondary | ICD-10-CM

## 2019-04-26 DIAGNOSIS — M48061 Spinal stenosis, lumbar region without neurogenic claudication: Secondary | ICD-10-CM

## 2019-04-27 NOTE — Addendum Note (Signed)
Addended by: Silverio Decamp on: 04/27/2019 08:47 AM   Modules accepted: Orders

## 2019-05-01 ENCOUNTER — Telehealth: Payer: Self-pay | Admitting: Physical Therapy

## 2019-05-01 ENCOUNTER — Encounter: Payer: PRIVATE HEALTH INSURANCE | Admitting: Physical Therapy

## 2019-05-01 NOTE — Telephone Encounter (Signed)
Patient did not show for PT appt and did not call to cancel.  Called patient regarding appt; no answer.  Message left requesting patient return call.  Number for Outpatient rehab left on voice mail.    Kerin Perna, PTA 05/01/19 12:07 PM

## 2019-05-06 MED FILL — traMADol HCL 50 MG TABS: 50 | 7 days supply | Qty: 14 | Fill #1

## 2019-05-11 ENCOUNTER — Other Ambulatory Visit: Payer: Self-pay | Admitting: Sports Medicine

## 2019-05-11 DIAGNOSIS — M1732 Unilateral post-traumatic osteoarthritis, left knee: Secondary | ICD-10-CM

## 2019-05-11 DIAGNOSIS — M48061 Spinal stenosis, lumbar region without neurogenic claudication: Secondary | ICD-10-CM

## 2019-05-12 ENCOUNTER — Encounter: Payer: Self-pay | Admitting: Sports Medicine

## 2019-05-12 ENCOUNTER — Ambulatory Visit (INDEPENDENT_AMBULATORY_CARE_PROVIDER_SITE_OTHER): Payer: PRIVATE HEALTH INSURANCE | Admitting: Sports Medicine

## 2019-05-12 DIAGNOSIS — M48061 Spinal stenosis, lumbar region without neurogenic claudication: Secondary | ICD-10-CM

## 2019-05-12 DIAGNOSIS — M1732 Unilateral post-traumatic osteoarthritis, left knee: Secondary | ICD-10-CM | POA: Diagnosis not present

## 2019-05-12 MED ORDER — TRAMADOL HCL 50 MG PO TABS: 50.0000 mg | ORAL_TABLET | Freq: Two times a day (BID) | ORAL | 0 refills | Status: AC | PRN

## 2019-05-12 NOTE — Assessment & Plan Note (Signed)
Partial improvement with steroid injection, continue Celebrex, tramadol. I think she is now a candidate for Visco supplementation, she will need to seek an orthopedic provider in [place]. Return as needed.

## 2019-05-12 NOTE — Progress Notes (Signed)
Subjective:    CC: Follow-up  HPI: This is a pleasant 63 year old female, her left knee still has some pain, and she was not able to get her epidural, see below for further details.  I reviewed the past medical history, family history, social history, surgical history, and allergies today and no changes were needed.  Please see the problem list section below in epic for further details.  Past Medical History: Past Medical History:  Diagnosis Date  . Anemia, unspecified   . Backache, unspecified   . Cervical spine disease   . Chest pain, unspecified   . Closed fracture of rib(s), unspecified   . Contact dermatitis and other eczema, due to unspecified cause   . Degeneration of cervical intervertebral disc   . Esophageal reflux   . Essential hypertension, benign   . Leiomyoma of uterus, unspecified   . Other and unspecified hyperlipidemia   . Other nonspecific abnormal cardiovascular system function study   . Pain in joint, lower leg   . Pre-operative cardiovascular examination   . Pure hypercholesterolemia   . Unspecified part of closed fracture of clavicle    Past Surgical History: Past Surgical History:  Procedure Laterality Date  . BILATERAL SALPINGOOPHORECTOMY    . CARPAL TUNNEL RELEASE  02/2002   right  . COLONOSCOPY  09/06/2010  . TOTAL ABDOMINAL HYSTERECTOMY     Social History: Social History   Socioeconomic History  . Marital status: Widowed    Spouse name: Not on file  . Number of children: 3  . Years of education: Not on file  . Highest education level: Not on file  Occupational History  . Not on file  Social Needs  . Financial resource strain: Not on file  . Food insecurity:    Worry: Not on file    Inability: Not on file  . Transportation needs:    Medical: Not on file    Non-medical: Not on file  Tobacco Use  . Smoking status: Never Smoker  . Smokeless tobacco: Never Used  Substance and Sexual Activity  . Alcohol use: No  . Drug use: No  .  Sexual activity: Not on file  Lifestyle  . Physical activity:    Days per week: Not on file    Minutes per session: Not on file  . Stress: Not on file  Relationships  . Social connections:    Talks on phone: Not on file    Gets together: Not on file    Attends religious service: Not on file    Active member of club or organization: Not on file    Attends meetings of clubs or organizations: Not on file    Relationship status: Not on file  Other Topics Concern  . Not on file  Social History Narrative   [place] Citizen. Recent trip to [place] and returned in 11/2008. Lost husband last year. Lives in [place]   Family History: Family History  Problem Relation Age of Onset  . Coronary artery disease Father   . Heart attack Father   . Hypertension Mother   . Coronary artery disease Brother    Allergies: Allergies  Allergen Reactions  . Aspirin     REACTION: in the injection form   Medications: See med rec.  Review of Systems: No fevers, chills, night sweats, weight loss, chest pain, or shortness of breath.   Objective:    General: Well Developed, well nourished, and in no acute distress.  Neuro: Alert and oriented x3, extra-ocular muscles  intact, sensation grossly intact.  HEENT: Normocephalic, atraumatic, pupils equal round reactive to light, neck supple, no masses, no lymphadenopathy, thyroid nonpalpable.  Skin: Warm and dry, no rashes. Cardiac: Regular rate and rhythm, no murmurs rubs or gallops, no lower extremity edema.  Respiratory: Clear to auscultation bilaterally. Not using accessory muscles, speaking in full sentences.  Impression and Recommendations:    Lumbar spinal stenosis L4-L5 spinal stenosis with left-sided posterior thigh radicular symptoms, continue gabapentin 300 twice daily, Celebrex twice daily, tramadol as needed. Initially we had planned a left L4-L5 interlaminar epidural but her insurance was out of network for our interventional list. She is going  to return to her home state of [place] and seek an epidural there.  Post-traumatic osteoarthritis of left knee Partial improvement with steroid injection, continue Celebrex, tramadol. I think she is now a candidate for Visco supplementation, she will need to seek an orthopedic provider in [place]. Return as needed.   ___________________________________________ Gwen Her. Dianah Field, M.D., ABFM., CAQSM. Primary Care and Sports Medicine [facility]  Adjunct Professor of [employer] Medicine

## 2019-05-12 NOTE — Assessment & Plan Note (Signed)
L4-L5 spinal stenosis with left-sided posterior thigh radicular symptoms, continue gabapentin 300 twice daily, Celebrex twice daily, tramadol as needed. Initially we had planned a left L4-L5 interlaminar epidural but her insurance was out of network for our interventional list. She is going to return to her home state of [place] and seek an epidural there.

## 2020-11-13 IMAGING — DX LEFT KNEE - COMPLETE 4+ VIEW
4 series · 4 of 4 positions shown · non-contrast
Comparison: None.

CLINICAL DATA: Acute left knee pain without known injury.

EXAM:
LEFT KNEE - COMPLETE 4+ VIEW

[knee lat]
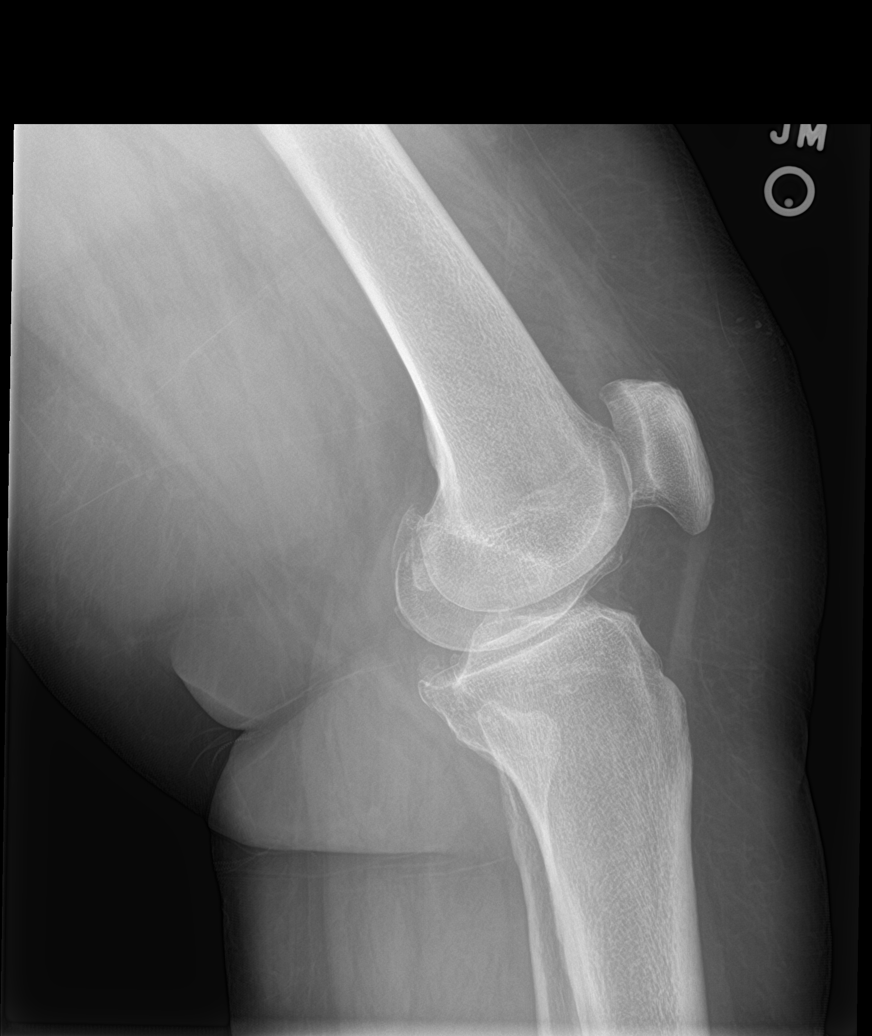

[knee sunrise]
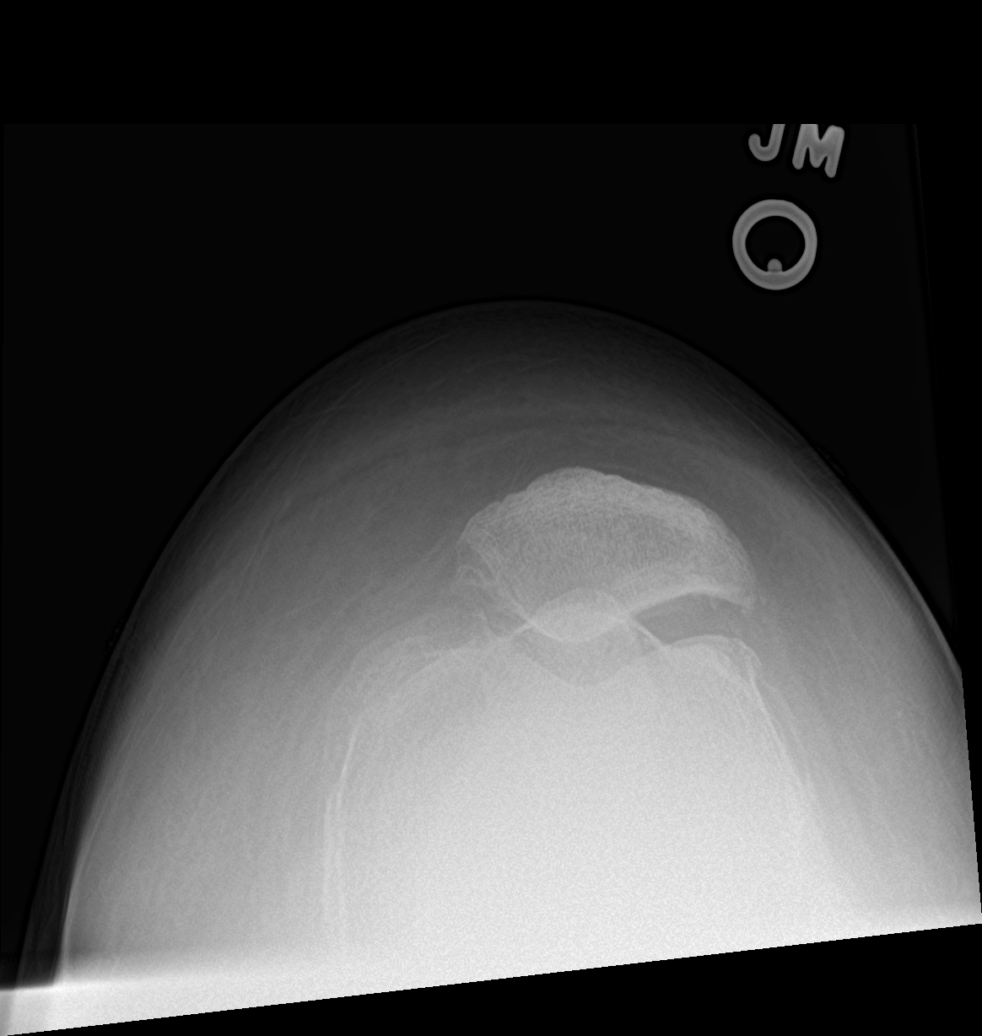

[knee ap bilat standing (1 of 2)]
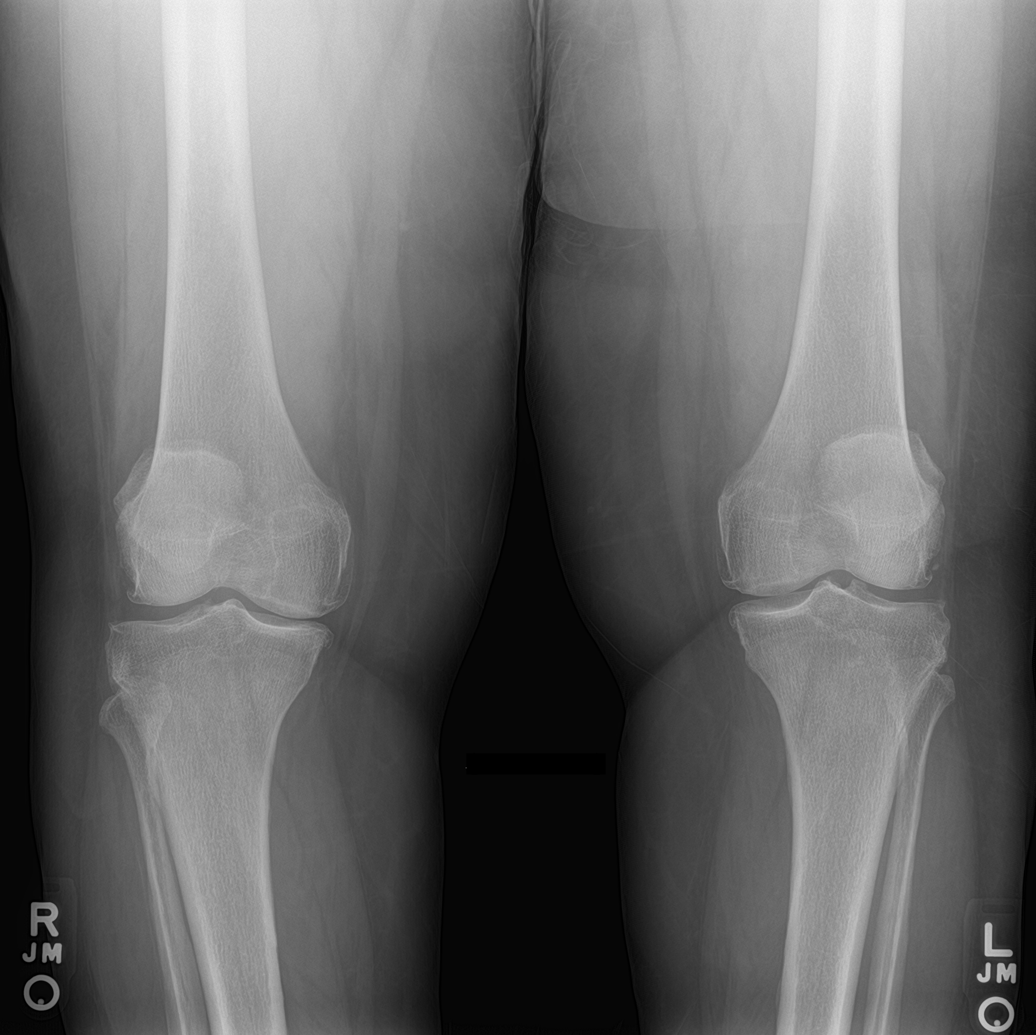

[knee ap bilat standing (2 of 2)]
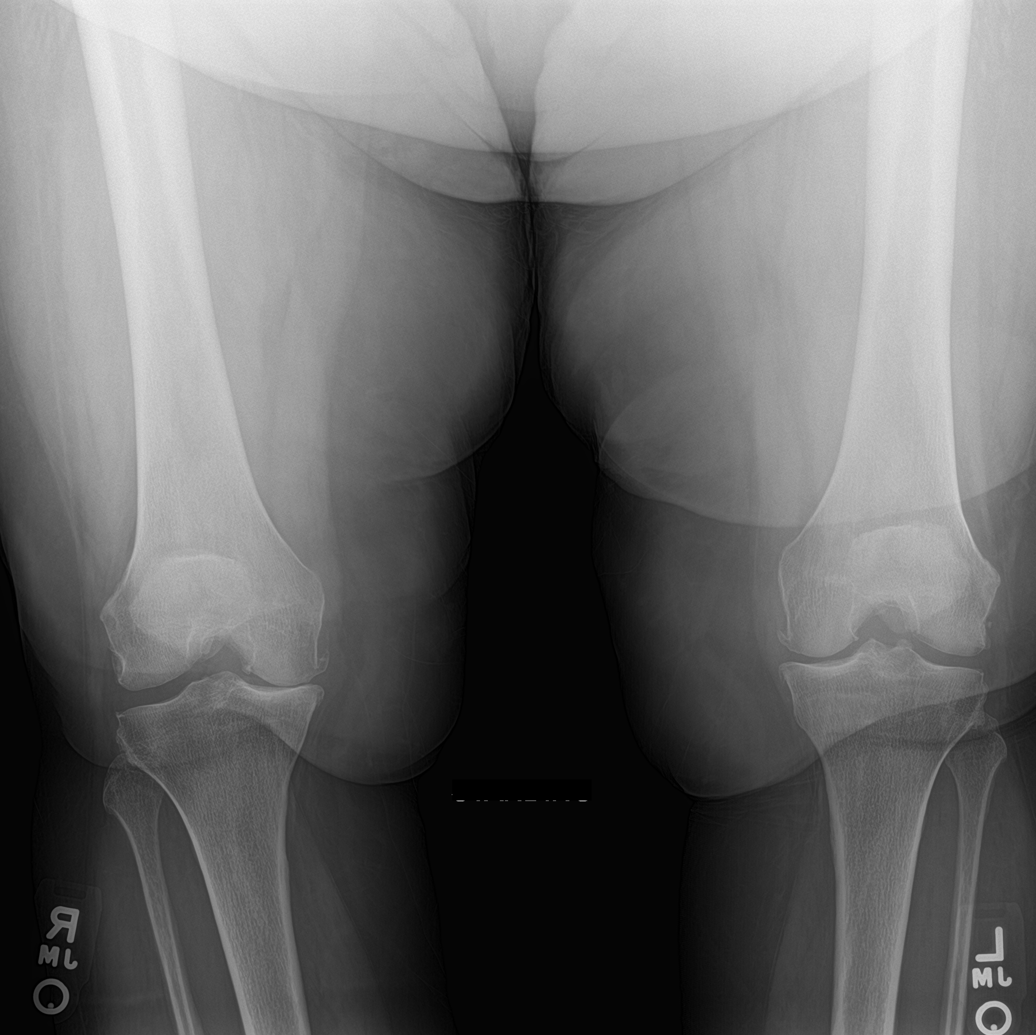

[4 of 4 positions shown; findings below may reference images not displayed]

FINDINGS: No evidence of fracture, dislocation, or joint effusion. Moderate
narrowing of medial joint space is noted with osteophyte formation.
Soft tissues are unremarkable.
IMPRESSION: Moderate degenerative joint disease is noted medially. No acute
abnormality seen in the left knee.

## 2020-11-13 IMAGING — DX LUMBAR SPINE - COMPLETE 4+ VIEW
5 series · 5 of 5 positions shown · non-contrast
Comparison: MRI August 24, 2011.

CLINICAL DATA: Chronic low back pain.

EXAM:
LUMBAR SPINE - COMPLETE 4+ VIEW

[l-spine ap]
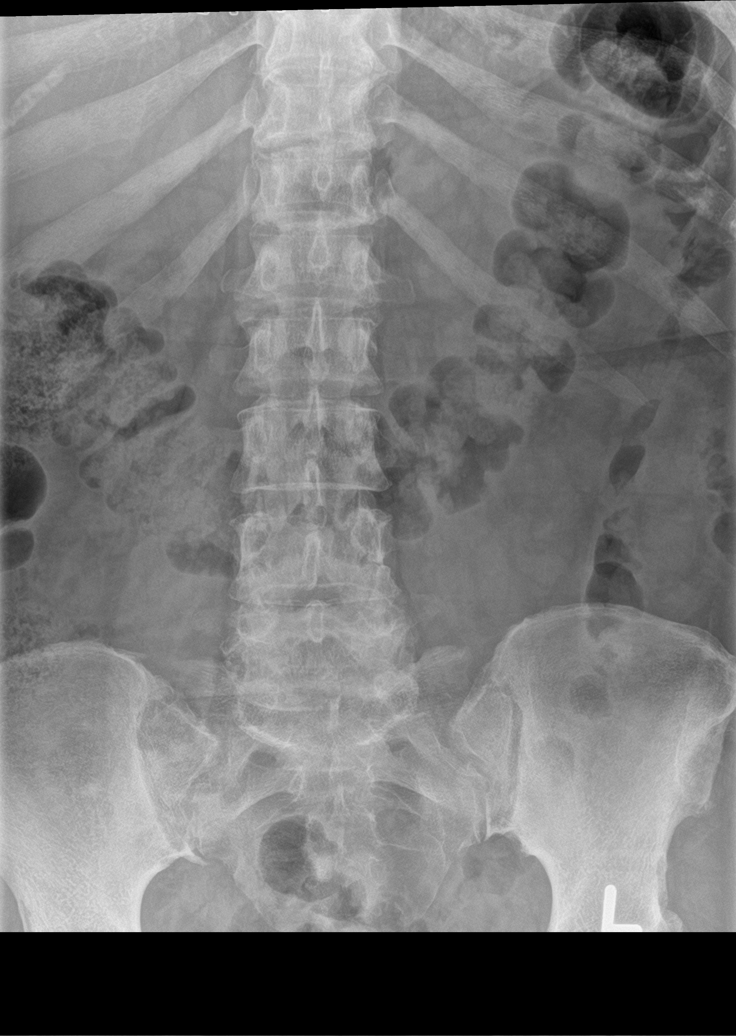

[l-spine obl (1 of 2)]
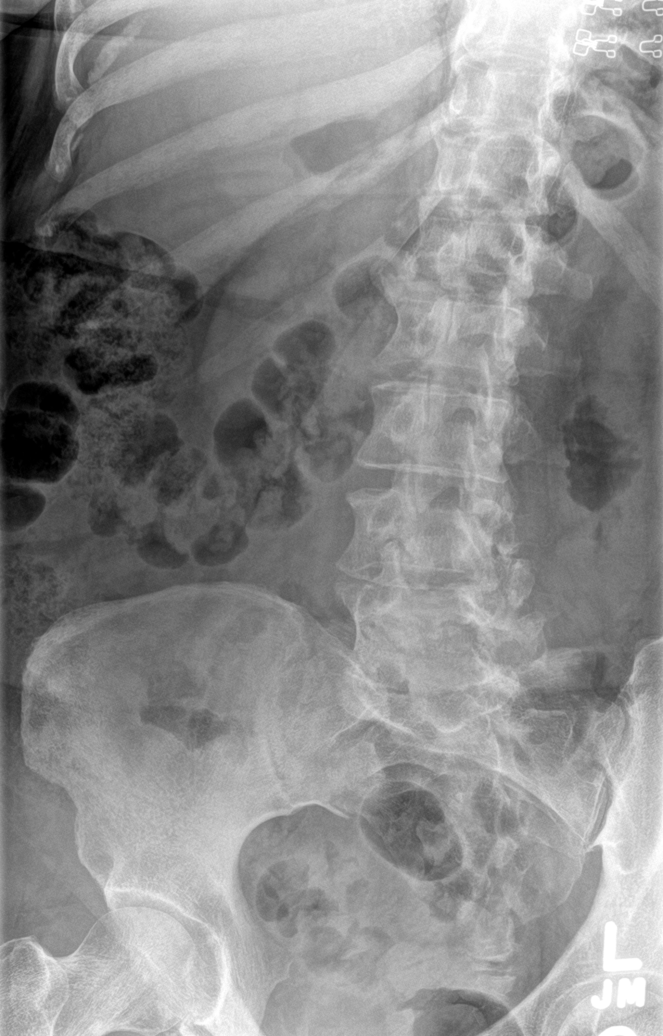

[l-spine obl (2 of 2)]
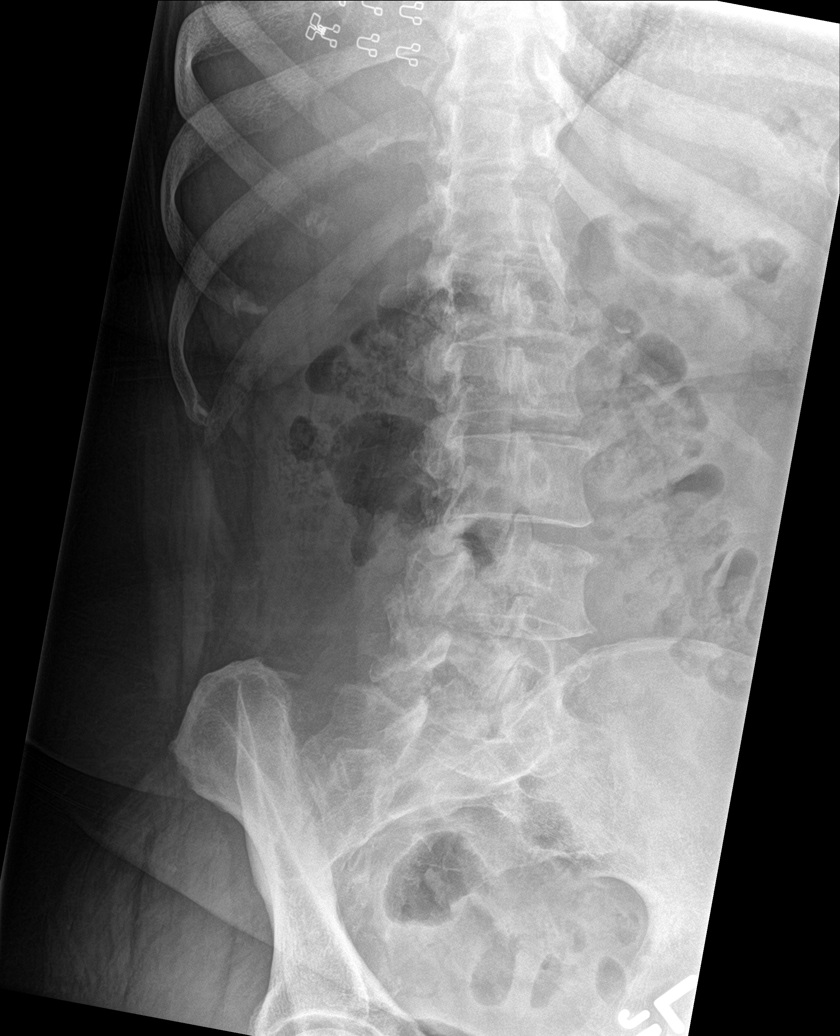

[l-spine lat]
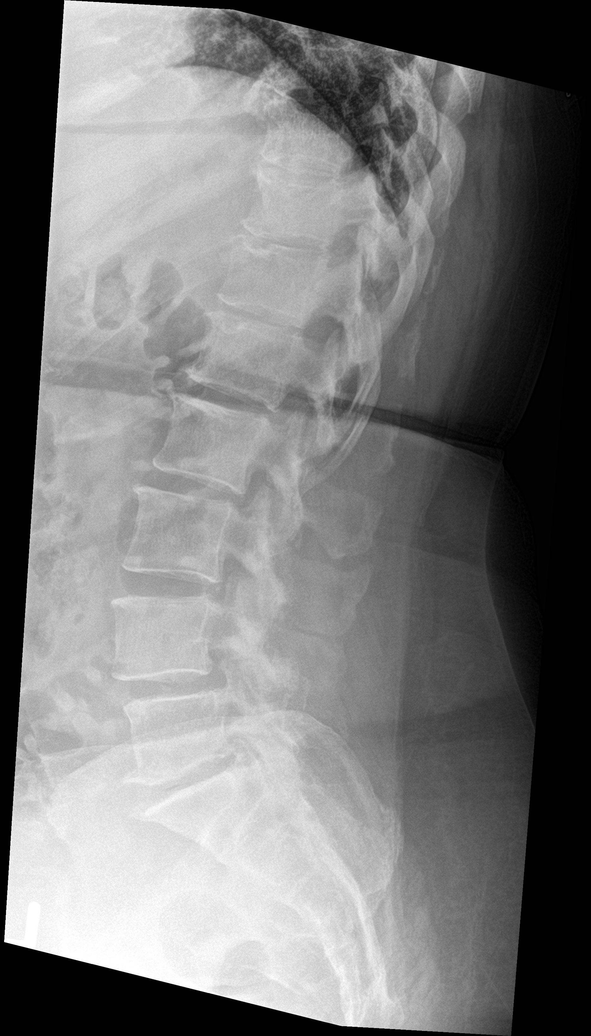

[l-spine spot]
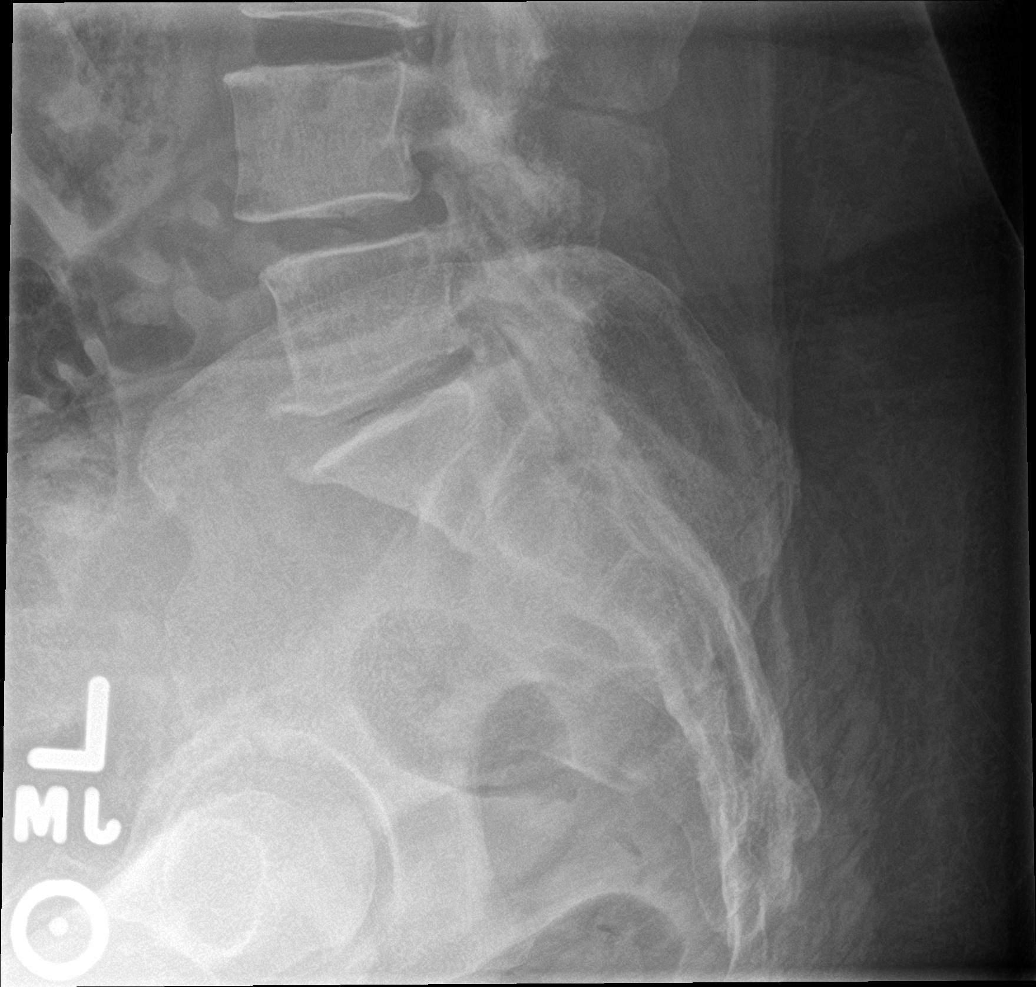

[5 of 5 positions shown; findings below may reference images not displayed]

FINDINGS: Minimal grade 1 anterolisthesis of L4-5 is noted secondary to
posterior facet joint hypertrophy. No fracture is noted. Mild
degenerative disc disease is noted at L1-2 and L5-S1 with anterior
osteophyte formation. Degenerative changes are also seen involving
posterior facet joints of L5-S1.
IMPRESSION: Multilevel degenerative disc disease as described above. No acute
abnormality seen in the lumbar spine.
